# Patient Record
Sex: Female | Born: 1965 | Race: White | Hispanic: No | Marital: Married | Smoking: Former smoker
Health system: Southern US, Community
[De-identification: ages and names within clinical notes are randomized; demographics above are authoritative.]

## PROBLEM LIST (undated history)

## (undated) DIAGNOSIS — F32A Depression, unspecified: Secondary | ICD-10-CM

## (undated) DIAGNOSIS — G8929 Other chronic pain: Secondary | ICD-10-CM

## (undated) DIAGNOSIS — M810 Age-related osteoporosis without current pathological fracture: Secondary | ICD-10-CM

## (undated) DIAGNOSIS — K219 Gastro-esophageal reflux disease without esophagitis: Secondary | ICD-10-CM

## (undated) DIAGNOSIS — E669 Obesity, unspecified: Secondary | ICD-10-CM

## (undated) DIAGNOSIS — F419 Anxiety disorder, unspecified: Secondary | ICD-10-CM

## (undated) DIAGNOSIS — M549 Dorsalgia, unspecified: Secondary | ICD-10-CM

## (undated) DIAGNOSIS — M1990 Unspecified osteoarthritis, unspecified site: Secondary | ICD-10-CM

## (undated) DIAGNOSIS — E785 Hyperlipidemia, unspecified: Secondary | ICD-10-CM

## (undated) DIAGNOSIS — F329 Major depressive disorder, single episode, unspecified: Secondary | ICD-10-CM

## (undated) DIAGNOSIS — G4700 Insomnia, unspecified: Secondary | ICD-10-CM

## (undated) HISTORY — DX: Major depressive disorder, single episode, unspecified: F32.9

## (undated) HISTORY — DX: Depression, unspecified: F32.A

## (undated) HISTORY — PX: COLONOSCOPY: SHX174

## (undated) HISTORY — DX: Hyperlipidemia, unspecified: E78.5

## (undated) HISTORY — DX: Unspecified osteoarthritis, unspecified site: M19.90

## (undated) HISTORY — DX: Obesity, unspecified: E66.9

## (undated) HISTORY — PX: ESOPHAGOGASTRODUODENOSCOPY: SHX1529

## (undated) HISTORY — PX: MANDIBLE SURGERY: SHX707

---

## 1999-01-04 ENCOUNTER — Other Ambulatory Visit: Admission: RE | Admit: 1999-01-04 | Discharge: 1999-01-04 | Payer: Self-pay | Admitting: Obstetrics & Gynecology

## 2000-01-13 ENCOUNTER — Other Ambulatory Visit: Admission: RE | Admit: 2000-01-13 | Discharge: 2000-01-13 | Payer: Self-pay | Admitting: Obstetrics & Gynecology

## 2002-07-29 ENCOUNTER — Other Ambulatory Visit: Admission: RE | Admit: 2002-07-29 | Discharge: 2002-07-29 | Payer: Self-pay | Admitting: Obstetrics & Gynecology

## 2003-08-05 ENCOUNTER — Other Ambulatory Visit: Admission: RE | Admit: 2003-08-05 | Discharge: 2003-08-05 | Payer: Self-pay | Admitting: Obstetrics & Gynecology

## 2003-09-12 ENCOUNTER — Inpatient Hospital Stay (HOSPITAL_COMMUNITY): Admission: RE | Admit: 2003-09-12 | Discharge: 2003-09-12 | Payer: Self-pay | Admitting: Family Medicine

## 2005-06-06 ENCOUNTER — Other Ambulatory Visit: Admission: RE | Admit: 2005-06-06 | Discharge: 2005-06-06 | Payer: Self-pay | Admitting: Obstetrics & Gynecology

## 2006-12-16 ENCOUNTER — Emergency Department (HOSPITAL_COMMUNITY): Admission: EM | Admit: 2006-12-16 | Discharge: 2006-12-16 | Payer: Self-pay | Admitting: Emergency Medicine

## 2009-05-07 ENCOUNTER — Encounter: Admission: RE | Admit: 2009-05-07 | Discharge: 2009-05-07 | Payer: Self-pay | Admitting: Internal Medicine

## 2009-08-20 ENCOUNTER — Encounter: Admission: RE | Admit: 2009-08-20 | Discharge: 2009-08-20 | Payer: Self-pay | Admitting: Internal Medicine

## 2010-11-09 ENCOUNTER — Other Ambulatory Visit: Payer: Self-pay | Admitting: Internal Medicine

## 2010-11-09 DIAGNOSIS — R109 Unspecified abdominal pain: Secondary | ICD-10-CM

## 2010-11-10 ENCOUNTER — Ambulatory Visit
Admission: RE | Admit: 2010-11-10 | Discharge: 2010-11-10 | Disposition: A | Discharge status: Home / Self Care | Payer: BC Managed Care – PPO | Source: Ambulatory Visit | Attending: Internal Medicine | Admitting: Internal Medicine

## 2010-11-10 DIAGNOSIS — R109 Unspecified abdominal pain: Secondary | ICD-10-CM

## 2010-11-10 MED ORDER — IOHEXOL 300 MG/ML  SOLN
100.0000 mL | Freq: Once | INTRAMUSCULAR | Status: AC | PRN
  Administered 2010-11-10: 100 mL via INTRAVENOUS

## 2011-09-26 ENCOUNTER — Encounter: Payer: Self-pay | Admitting: Internal Medicine

## 2011-09-30 ENCOUNTER — Encounter: Payer: Self-pay | Admitting: Internal Medicine

## 2011-10-04 ENCOUNTER — Ambulatory Visit (INDEPENDENT_AMBULATORY_CARE_PROVIDER_SITE_OTHER): Payer: BC Managed Care – PPO | Admitting: Internal Medicine

## 2011-10-04 ENCOUNTER — Encounter: Payer: Self-pay | Admitting: Internal Medicine

## 2011-10-04 VITALS — BP 120/80 | HR 100 | Ht 67.0 in | Wt 192.6 lb

## 2011-10-04 DIAGNOSIS — R109 Unspecified abdominal pain: Secondary | ICD-10-CM

## 2011-10-04 DIAGNOSIS — M1990 Unspecified osteoarthritis, unspecified site: Secondary | ICD-10-CM | POA: Insufficient documentation

## 2011-10-04 DIAGNOSIS — E785 Hyperlipidemia, unspecified: Secondary | ICD-10-CM | POA: Insufficient documentation

## 2011-10-04 DIAGNOSIS — Z8659 Personal history of other mental and behavioral disorders: Secondary | ICD-10-CM | POA: Insufficient documentation

## 2011-10-04 MED ORDER — TRAMADOL HCL 50 MG PO TABS
50.0000 mg | ORAL_TABLET | Freq: Four times a day (QID) | ORAL | Status: DC | PRN
Start: 2011-10-04 — End: 2012-05-17

## 2011-10-04 MED ORDER — HYOSCYAMINE SULFATE 0.125 MG SL SUBL: 0.1250 mg | SUBLINGUAL_TABLET | SUBLINGUAL | Status: DC | PRN

## 2011-10-04 MED ORDER — ESOMEPRAZOLE MAGNESIUM 40 MG PO CPDR: 40.0000 mg | DELAYED_RELEASE_CAPSULE | Freq: Every day | ORAL | Status: DC

## 2011-10-04 NOTE — Patient Instructions (Signed)
We have sent the following medications to your pharmacy for you to pick up at your convenience: Levsin, Nexium, Tramadol  Dr. Rhea Belton would like to see you back in the office in 4 weeks.

## 2011-10-04 NOTE — Progress Notes (Signed)
Subjective:    Patient ID: Christine Liu, female    DOB: 1966/07/25, 46 y.o.   MRN: [id number]  HPI Mrs. Ahmed is a 46 yo female with PMH of HL, depression, and arthritis who is seen in consultation at the request of Dr. Felipa Eth for evaluation of left flank pain.  The patient reports this pain has been off and on for almost a year.  The pain is located in her left side in the midaxillary line. She reports it is at times a constant dose severe ache. She has not been able to relate this pain to eating or bowel movement. It does seem to have been worse recently.  She denies heartburn. She does report some nausea with the pain but no vomiting. No dysphagia or odynophagia. Appetite has been good. She reports normal bowel movements for her without blood or melena. She says potentially the only thing that makes this pain better as not lying on her left side. It is not seem to get worse with any particular movement. She denies trauma. She reports her PCP performed an EKG which was normal. She also remembers a CT scan from 2012 which was reportedly normal. She has tried Naprosyn for this pain without much benefit. No fevers or chills. No shortness of breath, chest pain. No palpitations.  Review of Systems Constitutional: Negative for fever, chills, night sweats, activity change, appetite change and unexpected weight change, positive for fatigue HEENT: Negative for sore throat, mouth sores and trouble swallowing. Eyes: Negative for visual disturbance Respiratory: Negative for cough, chest tightness and shortness of breath Cardiovascular: Negative for chest pain, palpitations and lower extremity swelling Gastrointestinal: See history of present illness Genitourinary: Negative for dysuria and hematuria. Musculoskeletal: Negative for back pain, arthralgias and myalgias Skin: Negative for rash or color change Neurological: Negative for headaches, weakness, numbness Hematological: Negative for adenopathy, negative  for easy bruising/bleeding Psychiatric/behavioral: Positive for history of depressed mood, negative for anxiety, positive for sleep disturbance   Past Medical History  Diagnosis Date  . Arthritis   . Depression   . Hyperlipidemia    Past Surgical History  Procedure Date  . Mandible surgery     8th grade   Current Outpatient Prescriptions  Medication Sig Dispense Refill  . buPROPion (WELLBUTRIN) 100 MG tablet Take 100 mg by mouth daily.      . celecoxib (CELEBREX) 200 MG capsule Take 200 mg by mouth daily.      . cholecalciferol (VITAMIN D) 1000 UNITS tablet Take 1,000 Units by mouth daily.       No Known Allergies  Family History  Problem Relation Age of Onset  . Arthritis Mother     RA  . Osteoporosis Mother   . Hyperlipidemia Mother   . Colon cancer Neg Hx    History   Social History  . Marital Status: Married    Spouse Name: N/A    Number of Children: 1  . Years of Education: N/A   Occupational History  . 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 Smoking status: Former Smoker -- 4 years    Types: Cigarettes    Quit date: 10/03/1986  . Smokeless tobacco: Never Used  . Alcohol Use: Yes     6 drinks q weekend-beer  . Drug Use: No      Objective:   Physical Exam BP 120/80  Pulse 100  Ht 5\' 7"  (1.702 m)  Wt 192 lb 9.6 oz (87.363 kg)  BMI 30.17 kg/m2  LMP 08/18/2011 Constitutional: Well-developed and well-nourished. No distress. HEENT: Normocephalic and atraumatic. Oropharynx is clear and moist. No oropharyngeal exudate. Conjunctivae are normal. Pupils are equal round and reactive to light. No scleral icterus. Neck: Neck supple. Trachea midline. Cardiovascular: Normal rate, regular rhythm and intact distal pulses. No M/R/G Pulmonary/chest: Effort normal and breath sounds normal. No wheezing, rales or rhonchi. There is no pain with palpation over the left rib cage anteriorly or posteriorly. There is no CVA tenderness. There is no pain over the  vertebral processes. Abdominal: Soft, nontender, nondistended. Bowel sounds active throughout. There are no masses palpable. No hepatosplenomegaly. Extremities: no clubbing, cyanosis, or edema Lymphadenopathy: No cervical adenopathy noted. Neurological: Alert and oriented to person place and time. Skin: Skin is warm and dry. No rashes noted. Psychiatric: Normal mood and affect. Behavior is normal.  The patient recalls labs being done earlier in January 2013 by her PCP. She was told her glucose was mildly elevated but otherwise unremarkable. She does remember being told her liver numbers were normal. I will request her labs  Imaging Reviewed from 11/09/10 Clinical Data: left flank pain   CT ABDOMEN AND PELVIS WITH CONTRAST   Technique:  Multidetector CT imaging of the abdomen and pelvis was performed following the standard protocol during bolus administration of intravenous contrast.   Contrast: 100 ml of omni 300   Comparison: None   Findings: The lung bases appear clear.   No pericardial or pleural effusions noted.   There is no focal liver abnormalities identified.   The spleen appears normal.   The pancreas is negative.  The adrenal glands are negative.   The right kidney is unremarkable.  No evidence for mass or nephrolithiasis.  No obstructive uropathy.   There is a small 9 mm hypodensity within the upper pole of the left kidney, image 30.  Likely simple cyst.  There is no left-sided nephrolithiasis or hydronephrosis.   The ureters appear patent.  No evidence for hydroureter or ureterolithiasis.   The urinary bladder is normal.   There is no enlarged lymph node within the upper abdomen.   There is no pelvic or inguinal adenopathy.   Uterus and adnexal structures have a normal appearance   The stomach and small bowel loops are unremarkable.  The appendix is negative.  The colon is unremarkable.   Review of the visualized osseous structures is unremarkable.     IMPRESSION:   1.  No findings to explain the patient's left flank pain. Specifically, no evidence for nephrolithiasis or obstructive uropathy.      Assessment & Plan:  46 yo female with PMH of HL, depression, and arthritis who is seen in consultation at the request of Dr. Felipa Eth for evaluation of left flank pain.  1. Left flank pain -- the patient's pain has been present off and on for the better part of a year, but there is no clear indication as to its etiology. It does not seem to relate to her GI tract specifically, and is not exacerbated by eating or bowel movement. He does not seem consistent with IBS at this point given his lack of association to her other GI functions.  I think it is reasonable to give her a trial of acid suppression therapy, and therefore I have prescribed Nexium 40 mg daily. It is possible that this is related to acid peptic disease such as gastritis or PUD, so trial of PPI seems reasonable.  I also will prescribe Levsin which  she can use as needed and as directed for spasm to see if this helps her pain. Also prescribe tramadol 50 - 100 mg up to 3 times a day prn pain.  I will see her back in 4 weeks' time to reassess her symptoms and gauge her response to therapy.  If things are not improved other considerations include endoscopy or repeat imaging. She is happy with this plan.

## 2011-11-03 ENCOUNTER — Encounter: Payer: Self-pay | Admitting: Internal Medicine

## 2011-11-04 ENCOUNTER — Encounter: Payer: Self-pay | Admitting: Internal Medicine

## 2011-11-04 ENCOUNTER — Ambulatory Visit (INDEPENDENT_AMBULATORY_CARE_PROVIDER_SITE_OTHER): Payer: BC Managed Care – PPO | Admitting: Internal Medicine

## 2011-11-04 DIAGNOSIS — R109 Unspecified abdominal pain: Secondary | ICD-10-CM

## 2011-11-04 MED ORDER — HYOSCYAMINE SULFATE 0.125 MG SL SUBL: 0.1250 mg | SUBLINGUAL_TABLET | SUBLINGUAL | Status: DC | PRN

## 2011-11-04 NOTE — Progress Notes (Signed)
  Subjective:    Patient ID: Christine Liu, female    DOB: 1966/05/04, 46 y.o.   MRN: [id number]  HPI Christine Liu is a 46 yo female with PMH of HL, depression, and arthritis who is seen in followup for left flank pain. Again, this pain has been going off and on for almost a year, is located in her left upper quadrant and left side. She reports this is continued to happen on and off, and seems to be happening more frequently. She also had an episode of right sided abdominal pain, again sharp and crampy in nature. Still does not relate this to eating or bowel movement. No benefit with Nexium. Has used sublingual Levsin with some benefit, and took the tramadol on several occasions which also seemed to help. Some nausea still but no vomiting. No dysphagia or odynophagia. Appetite still good. Bowel habits are normal. No blood or melena. No fevers or chills.  Review of Systems As per history of present illness otherwise negative  Current Medications, Allergies, Past Medical History, Past Surgical History, Family History and Social History were reviewed in [employer] record.     Objective:   Physical Exam BP 112/72  Pulse 119  Ht 5\' 7"  (1.702 m)  Wt 191 lb (86.637 kg)  BMI 29.91 kg/m2  SpO2 99%  LMP 10/08/2011 Constitutional: Well-developed and well-nourished. No distress. HEENT: Normocephalic and atraumatic. Oropharynx is clear and moist. No oropharyngeal exudate. Conjunctivae are normal. Pupils are equal round and reactive to light. No scleral icterus. Cardiovascular: Normal rate, regular rhythm and intact distal pulses. No M/R/G Pulmonary/chest: Effort normal and breath sounds normal. No wheezing, rales or rhonchi. Abdominal: Soft, mild left middle tenderness to palpation without rebound or guarding, nondistended. Bowel sounds active throughout. There are no masses palpable. No hepatosplenomegaly. Extremities: no clubbing, cyanosis, or edema Neurological: Alert and  oriented to person place and time. Skin: Skin is warm and dry. No rashes noted. Psychiatric: Normal mood and affect. Behavior is normal.  Labs: awaiting records from PCP    Assessment & Plan:  Christine Liu is a 46 yo female with PMH of HL, depression, and arthritis who is seen in followup for left flank pain.  1. Left flank pain -- the pain remains nonspecific and does not seem to definitely relate to eating or bowel movement. She's not having diarrhea or constipation.  At this point I think the best next step is CT abdomen and pelvis with contrast to better evaluate her pain. This will be ordered for today and if negative or unrevealing, then I recommended upper and lower endoscopy for further characterization and evaluation this pain. We discussed EGD and colonoscopy and the risk and benefits and she is agreeable to proceed if necessary. We will know more after the CT her abdomen and pelvis. I've advised that she stop taking her Nexium as it did not seem to benefit her at all. She can continue the Levsin and tramadol as needed. I will contact her once the CT result is available. She voices understanding and is happy with this plan.

## 2011-11-04 NOTE — Patient Instructions (Signed)
You have been scheduled for a CT scan of the abdomen and pelvis at [facility] ([address]---this is in the same building as [facility]).   You are scheduled on 11/07/2011 at 11:00. You should arrive 15 minutes prior to your appointment time for registration. Please follow the written instructions below on the day of your exam:  WARNING: IF YOU ARE ALLERGIC TO IODINE/X-RAY DYE, PLEASE NOTIFY RADIOLOGY IMMEDIATELY AT [phone]! YOU WILL BE GIVEN A 13 HOUR PREMEDICATION PREP.  1) Do not eat or drink anything after 7:00am (4 hours prior to your test) 2) You have been given 2 bottles of oral contrast to drink. The solution may tast better if refrigerated, but do NOT add ice or any other liquid to this solution. Shake             well before drinking.    Drink 1 bottle of contrast @ 9:00 (2 hours prior to your exam)  Drink 1 bottle of contrast @ 10:00 (1 hour prior to your exam)  You may take any medications as prescribed with a small amount of water except for the following: Metformin, Glucophage, Glucovance, Avandamet, Riomet, Fortamet, Actoplus Met, Janumet, Glumetza or Metaglip. The above medications must be held the day of the exam AND 48 hours after the exam.  The purpose of you drinking the oral contrast is to aid in the visualization of your intestinal tract. The contrast solution may cause some diarrhea. Before your exam is started, you will be given a small amount of fluid to drink. Depending on your individual set of symptoms, you may also receive an intravenous injection of x-ray contrast/dye. Plan on being at [facility] for 30 minutes or long, depending on the type of exam you are having performed.  If you have any questions regarding your exam or if you need to reschedule, you may call the CT department at [phone] between the hours of 8:00 am and 5:00 pm, Monday-Friday.  Stop taking Nexium and continue taking tramodol and  levsin  ________________________________________________________________________

## 2011-11-07 ENCOUNTER — Ambulatory Visit (INDEPENDENT_AMBULATORY_CARE_PROVIDER_SITE_OTHER)
Admission: RE | Admit: 2011-11-07 | Discharge: 2011-11-07 | Disposition: A | Discharge status: Home / Self Care | Payer: BC Managed Care – PPO | Source: Ambulatory Visit | Attending: Internal Medicine | Admitting: Internal Medicine

## 2011-11-07 DIAGNOSIS — R10A1 Flank pain, right side: Secondary | ICD-10-CM

## 2011-11-07 DIAGNOSIS — R109 Unspecified abdominal pain: Secondary | ICD-10-CM

## 2011-11-07 DIAGNOSIS — R10A2 Flank pain, left side: Secondary | ICD-10-CM

## 2011-11-07 MED ORDER — IOHEXOL 300 MG/ML  SOLN
100.0000 mL | Freq: Once | INTRAMUSCULAR | Status: AC | PRN
  Administered 2011-11-07: 100 mL via INTRAVENOUS

## 2011-11-08 ENCOUNTER — Telehealth: Payer: Self-pay | Admitting: *Deleted

## 2011-11-08 NOTE — Telephone Encounter (Signed)
Informed pt of her CT rsults and scheduled her for PV and ECL with Propofol.

## 2011-11-08 NOTE — Telephone Encounter (Signed)
CT is unremarkable and shows a stable hemangioma in her liver, which is of likely no medical concern No findings to explain abdominal pain. Proceed to EGD and colonoscopy as discussed in clinic. Please schedule this on a propofol day   lmom for pt to call back.

## 2011-11-08 NOTE — Telephone Encounter (Signed)
Pt called me back and LM and then I left her a message. Gave her available times for ECL appts: 11/24/11 and 11/29/11. Work/school [phone].

## 2011-11-18 ENCOUNTER — Ambulatory Visit (AMBULATORY_SURGERY_CENTER): Payer: BC Managed Care – PPO | Admitting: *Deleted

## 2011-11-18 VITALS — Ht 67.0 in | Wt 193.8 lb

## 2011-11-18 DIAGNOSIS — R160 Hepatomegaly, not elsewhere classified: Secondary | ICD-10-CM

## 2011-11-18 MED ORDER — PEG-KCL-NACL-NASULF-NA ASC-C 100 G PO SOLR: ORAL | Status: DC

## 2011-11-23 ENCOUNTER — Telehealth: Payer: Self-pay | Admitting: Gastroenterology

## 2011-11-23 ENCOUNTER — Telehealth: Payer: Self-pay | Admitting: *Deleted

## 2011-11-23 NOTE — Telephone Encounter (Signed)
lvm for pt to call me back about moving her ECL from 2pm to 10am.

## 2011-11-23 NOTE — Telephone Encounter (Signed)
Left message for patient to call back.  Calling to see if she would want to move her appointment up to an earlier available morning slot.

## 2011-11-24 ENCOUNTER — Ambulatory Visit (AMBULATORY_SURGERY_CENTER): Payer: BC Managed Care – PPO | Admitting: Internal Medicine

## 2011-11-24 ENCOUNTER — Encounter: Payer: Self-pay | Admitting: Internal Medicine

## 2011-11-24 DIAGNOSIS — R109 Unspecified abdominal pain: Secondary | ICD-10-CM

## 2011-11-24 MED ORDER — SODIUM CHLORIDE 0.9 % IV SOLN: 500.0000 mL | INTRAVENOUS | Status: DC

## 2011-11-24 NOTE — Progress Notes (Signed)
Patient did not have preoperative order for IV antibiotic SSI prophylaxis. (G8918)  Patient did not experience any of the following events: a burn prior to discharge; a fall within the facility; wrong site/side/patient/procedure/implant event; or a hospital transfer or hospital admission upon discharge from the facility. (G8907)  

## 2011-11-24 NOTE — Op Note (Signed)
 [facility] [address] [place], [place]  [address]  ENDOSCOPY PROCEDURE REPORT  PATIENT:  Christine Liu, Christine Liu  MR#:  [id number] BIRTHDATE:  [birth year], 45 yrs. old  GENDER:  female ENDOSCOPIST:  Carie Caddy. Nykira Reddix, MD Referred by:  Chilton Greathouse, M.D. PROCEDURE DATE:  11/24/2011 PROCEDURE:  EGD, diagnostic 43235 ASA CLASS:  Class I INDICATIONS:  abdominal pain, left upper quadrant MEDICATIONS:   MAC sedation, administered by CRNA, propofol (Diprivan) 200 mg IV TOPICAL ANESTHETIC:  none  DESCRIPTION OF PROCEDURE:   After the risks benefits and alternatives of the procedure were thoroughly explained, informed consent was obtained.  The [facility] [id number] endoscope was introduced through the mouth and advanced to the second portion of the duodenum, without limitations.  The instrument was slowly withdrawn as the mucosa was fully examined. <<PROCEDUREIMAGES>>  The upper, middle, and distal third of the esophagus were carefully inspected and no abnormalities were noted. The z-line was well seen at the GEJ. The endoscope was pushed into the fundus which was normal including a retroflexed view. The antrum,gastric body, first and second part of the duodenum were unremarkable. Retroflexed views revealed no abnormalities.    The scope was then withdrawn from the patient and the procedure completed.  COMPLICATIONS:  None  ENDOSCOPIC IMPRESSION: 1) Normal EGD  RECOMMENDATIONS: 1) Continue current medications. 2) Follow up as needed.  Carie Caddy. Rhea Belton, MD  CC:  Chilton Greathouse, MD The Patient  n. eSIGNEDCarie Caddy. Kimbree Casanas at 11/24/2011 02:09 PM  Donata Clay, [phone]

## 2011-11-24 NOTE — Patient Instructions (Signed)
YOU HAD AN ENDOSCOPIC PROCEDURE TODAY AT THE [facility]: Refer to the procedure report that was given to you for any specific questions about what was found during the examination.  If the procedure report does not answer your questions, please call your gastroenterologist to clarify.  If you requested that your care partner not be given the details of your procedure findings, then the procedure report has been included in a sealed envelope for you to review at your convenience later.  YOU SHOULD EXPECT: Some feelings of bloating in the abdomen. Passage of more gas than usual.  Walking can help get rid of the air that was put into your GI tract during the procedure and reduce the bloating. If you had a lower endoscopy (such as a colonoscopy or flexible sigmoidoscopy) you may notice spotting of blood in your stool or on the toilet paper. If you underwent a bowel prep for your procedure, then you may not have a normal bowel movement for a few days.  DIET: Your first meal following the procedure should be a light meal and then it is ok to progress to your normal diet.  A half-sandwich or bowl of soup is an example of a good first meal.  Heavy or fried foods are harder to digest and may make you feel nauseous or bloated.  Likewise meals heavy in dairy and vegetables can cause extra gas to form and this can also increase the bloating.  Drink plenty of fluids but you should avoid alcoholic beverages for 24 hours.  ACTIVITY: Your care partner should take you home directly after the procedure.  You should plan to take it easy, moving slowly for the rest of the day.  You can resume normal activity the day after the procedure however you should NOT DRIVE or use heavy machinery for 24 hours (because of the sedation medicines used during the test).    SYMPTOMS TO REPORT IMMEDIATELY: A gastroenterologist can be reached at any hour.  During normal business hours, 8:30 AM to 5:00 PM Monday through Friday,  call [phone].  After hours and on weekends, please call the GI answering service at [phone] who will take a message and have the physician on call contact you.   Following lower endoscopy (colonoscopy or flexible sigmoidoscopy):  Excessive amounts of blood in the stool  Significant tenderness or worsening of abdominal pains  Swelling of the abdomen that is new, acute  Fever of 100F or higher  Following upper endoscopy (EGD)  Vomiting of blood or coffee ground material  New chest pain or pain under the shoulder blades  Painful or persistently difficult swallowing  New shortness of breath  Fever of 100F or higher  Black, tarry-looking stools  FOLLOW UP: If any biopsies were taken you will be contacted by phone or by letter within the next 1-3 weeks.  Call your gastroenterologist if you have not heard about the biopsies in 3 weeks.  Our staff will call the home number listed on your records the next business day following your procedure to check on you and address any questions or concerns that you may have at that time regarding the information given to you following your procedure. This is a courtesy call and so if there is no answer at the home number and we have not heard from you through the emergency physician on call, we will assume that you have returned to your regular daily activities without incident.  SIGNATURES/CONFIDENTIALITY: You and/or your care   partner have signed paperwork which will be entered into your electronic medical record.  These signatures attest to the fact that that the information above on your After Visit Summary has been reviewed and is understood.  Full responsibility of the confidentiality of this discharge information lies with you and/or your care-partner.  

## 2011-11-24 NOTE — Op Note (Signed)
[facility] [address] [place], [place]  [address]  COLONOSCOPY PROCEDURE REPORT  PATIENT:  Christine Liu, Christine Liu  MR#:  [id number] BIRTHDATE:  1966/07/21, 45 yrs. old  GENDER:  female ENDOSCOPIST:  Carie Caddy. Ahnika Hannibal, MD REF. BY:  Chilton Greathouse, M.D. PROCEDURE DATE:  11/24/2011 PROCEDURE:  Diagnostic Colonoscopy ASA CLASS:  Class I INDICATIONS:  Abdominal pain MEDICATIONS:   MAC sedation, administered by CRNA, propofol (Diprivan) 200 mg IV  DESCRIPTION OF PROCEDURE:   After the risks benefits and alternatives of the procedure were thoroughly explained, informed consent was obtained.  Digital rectal exam was performed and revealed no rectal masses.   The [id number] endoscope was introduced through the anus and advanced to the terminal ileum which was intubated for a short distance, without limitations. The quality of the prep was good, using MoviPrep.  The instrument was then slowly withdrawn as the colon was fully examined. <<PROCEDUREIMAGES>>  FINDINGS:  The terminal ileum appeared normal.  Normal colonoscopy without polyps, masses, vascular ectasias, or inflammatory changes.   Retroflexed views in the rectum revealed no abnormalities. The scope was then withdrawn  from the cecum and the procedure completed.  COMPLICATIONS:  None  ENDOSCOPIC IMPRESSION: 1) Normal terminal ileum 2) Normal colonoscopy  RECOMMENDATIONS: 1) Continue current medications 2) Follow up as needed  Jaysion Ramseyer M. Rhea Belton, MD  CC:  Chilton Greathouse, MD The Patient  n. eSIGNEDCarie Caddy. Thresea Doble at 11/24/2011 02:13 PM  Donata Clay, [phone]

## 2011-11-25 ENCOUNTER — Telehealth: Payer: Self-pay | Admitting: *Deleted

## 2011-11-25 NOTE — Telephone Encounter (Signed)
  Follow up Call-  Call back number 11/24/2011  Post procedure Call Back phone  # [phone]  Permission to leave phone message Yes     Patient questions:  Another person's voice was on the machine with another last name.  No message left.

## 2012-01-02 ENCOUNTER — Other Ambulatory Visit: Payer: Self-pay | Admitting: Neurological Surgery

## 2012-01-25 ENCOUNTER — Inpatient Hospital Stay (HOSPITAL_COMMUNITY): Admission: RE | Admit: 2012-01-25 | Discharge: 2012-01-25 | Payer: BC Managed Care – PPO | Source: Ambulatory Visit

## 2012-01-25 ENCOUNTER — Encounter (HOSPITAL_COMMUNITY): Payer: Self-pay

## 2012-01-25 HISTORY — DX: Gastro-esophageal reflux disease without esophagitis: K21.9

## 2012-01-25 NOTE — Pre-Procedure Instructions (Signed)
20 Christine Liu  01/25/2012   Your procedure is scheduled on: May 30  Report to [facility] at 07:45 AM.  Call this number if you have problems the morning of surgery: [phone]   Remember:   Do not eat food:After Midnight.  May have clear liquids: up to 4 Hours before arrival.  Clear liquids include soda, tea, black coffee, apple or grape juice, broth.  Take these medicines the morning of surgery with A SIP OF WATER: Wellbutrin, Celebrex, Nexium, Hyoscymine, tramadol   Do not wear jewelry, make-up or nail polish.  Do not wear lotions, powders, or perfumes. You may wear deodorant.  Do not shave 48 hours prior to surgery. Men may shave face and neck.  Do not bring valuables to the hospital.  Contacts, dentures or bridgework may not be worn into surgery.  Leave suitcase in the car. After surgery it may be brought to your room.  For patients admitted to the hospital, checkout time is 11:00 AM the day of discharge.   Patients discharged the day of surgery will not be allowed to drive home.  Special Instructions: CHG Shower Use Special Wash: 1/2 bottle night before surgery and 1/2 bottle morning of surgery.   Please read over the following fact sheets that you were given: Pain Booklet, Coughing and Deep Breathing, Blood Transfusion Information and Surgical Site Infection Prevention

## 2012-01-27 ENCOUNTER — Encounter (HOSPITAL_COMMUNITY)
Admission: RE | Admit: 2012-01-27 | Discharge: 2012-01-27 | Disposition: A | Discharge status: Home / Self Care | Payer: BC Managed Care – PPO | Source: Ambulatory Visit | Attending: Neurological Surgery | Admitting: Neurological Surgery

## 2012-01-27 DIAGNOSIS — Z01811 Encounter for preprocedural respiratory examination: Secondary | ICD-10-CM | POA: Insufficient documentation

## 2012-01-27 DIAGNOSIS — I498 Other specified cardiac arrhythmias: Secondary | ICD-10-CM | POA: Insufficient documentation

## 2012-01-27 DIAGNOSIS — Z01810 Encounter for preprocedural cardiovascular examination: Secondary | ICD-10-CM | POA: Insufficient documentation

## 2012-01-27 DIAGNOSIS — Z01812 Encounter for preprocedural laboratory examination: Secondary | ICD-10-CM | POA: Insufficient documentation

## 2012-01-27 LAB — COMPREHENSIVE METABOLIC PANEL
ALT: 15 U/L (ref 0–35)
Alkaline Phosphatase: 70 U/L (ref 39–117)
CO2: 27 mEq/L (ref 19–32)
Chloride: 100 mEq/L (ref 96–112)
GFR calc Af Amer: >90 mL/min (ref >90)
GFR calc non Af Amer: 80 mL/min — ABNORMAL LOW (ref >90)
Glucose, Bld: 81 mg/dL (ref 70–99)
Potassium: 4.6 mEq/L (ref 3.5–5.1)
Sodium: 136 mEq/L (ref 135–145)
Total Bilirubin: 0.6 mg/dL (ref 0.3–1.2)

## 2012-01-27 LAB — CBC
HCT: 39.3 % (ref 36.0–46.0)
Hemoglobin: 13.5 g/dL (ref 12.0–15.0)
RBC: 4.37 MIL/uL (ref 3.87–5.11)

## 2012-01-27 LAB — SURGICAL PCR SCREEN
MRSA, PCR: NEGATIVE
Staphylococcus aureus: POSITIVE — AB

## 2012-01-27 LAB — ABO/RH: ABO/RH(D): O POS

## 2012-02-02 ENCOUNTER — Encounter (HOSPITAL_COMMUNITY): Admission: RE | Payer: Self-pay | Source: Ambulatory Visit

## 2012-02-02 ENCOUNTER — Ambulatory Visit (HOSPITAL_COMMUNITY)
Admission: RE | Admit: 2012-02-02 | Payer: BC Managed Care – PPO | Source: Ambulatory Visit | Admitting: Neurological Surgery

## 2012-02-02 SURGERY — POSTERIOR LUMBAR FUSION 1 LEVEL
Anesthesia: General | Site: Back

## 2012-03-20 ENCOUNTER — Other Ambulatory Visit: Payer: Self-pay | Admitting: Neurological Surgery

## 2012-04-04 ENCOUNTER — Other Ambulatory Visit (HOSPITAL_COMMUNITY): Payer: BC Managed Care – PPO

## 2012-04-06 NOTE — Pre-Procedure Instructions (Signed)
[address] [place]  04/06/2012   Your procedure is scheduled on:  Wednesday April 11, 2012.  Report to [facility] at 0630 AM.  Call this number if you have problems the morning of surgery: [phone]   Remember:   Do not eat food or drink:After Midnight.    Take these medicines the morning of surgery with A SIP OF WATER: Bupropion (Wellbutrin), Esomeprazole (Nexium), and Tramadol (Ultram) if needed for pain.   Do not wear jewelry, make-up or nail polish.  Do not wear lotions, powders, or perfumes.   Do not shave 48 hours prior to surgery.   Do not bring valuables to the hospital.  Contacts, dentures or bridgework may not be worn into surgery.  Leave suitcase in the car. After surgery it may be brought to your room.  For patients admitted to the hospital, checkout time is 11:00 AM the day of discharge.   Patients discharged the day of surgery will not be allowed to drive home.  Name and phone number of your driver:   Special Instructions: CHG Shower Use Special Wash: 1/2 bottle night before surgery and 1/2 bottle morning of surgery.   Please read over the following fact sheets that you were given: Pain Booklet, Coughing and Deep Breathing, Blood Transfusion Information, MRSA Information and Surgical Site Infection Prevention

## 2012-04-09 ENCOUNTER — Encounter (HOSPITAL_COMMUNITY)
Admission: RE | Admit: 2012-04-09 | Discharge: 2012-04-09 | Payer: BC Managed Care – PPO | Source: Ambulatory Visit | Attending: Neurological Surgery | Admitting: Neurological Surgery

## 2012-04-09 NOTE — Progress Notes (Signed)
Patient called and voicemail was left requesting patient to return call at earliest convenience concerning no show to 0900 scheduled preadmit appointment.

## 2012-04-11 ENCOUNTER — Inpatient Hospital Stay (HOSPITAL_COMMUNITY)
Admission: RE | Admit: 2012-04-11 | Payer: BC Managed Care – PPO | Source: Ambulatory Visit | Admitting: Neurological Surgery

## 2012-04-11 ENCOUNTER — Encounter (HOSPITAL_COMMUNITY): Admission: RE | Payer: Self-pay | Source: Ambulatory Visit

## 2012-04-11 SURGERY — POSTERIOR LUMBAR FUSION 1 LEVEL
Anesthesia: General | Site: Back

## 2012-04-30 ENCOUNTER — Other Ambulatory Visit: Payer: Self-pay | Admitting: Neurological Surgery

## 2012-05-15 ENCOUNTER — Encounter (HOSPITAL_COMMUNITY)
Admission: RE | Admit: 2012-05-15 | Payer: BC Managed Care – PPO | Source: Ambulatory Visit | Attending: Neurosurgery | Admitting: Neurosurgery

## 2012-05-15 HISTORY — DX: Anxiety disorder, unspecified: F41.9

## 2012-05-15 HISTORY — DX: Dorsalgia, unspecified: M54.9

## 2012-05-15 HISTORY — DX: Other chronic pain: G89.29

## 2012-05-15 HISTORY — DX: Insomnia, unspecified: G47.00

## 2012-05-15 NOTE — Progress Notes (Signed)
Called patient regarding PAT no answer left VM

## 2012-05-15 NOTE — Pre-Procedure Instructions (Signed)
20 Christine Liu  05/15/2012   Your procedure is scheduled on:  September 12  Report to [facility] at 05:30 AM.  Call this number if you have problems the morning of surgery: [phone]   Remember:   Do not eat or drink:After Midnight.  Take these medicines the morning of surgery with A SIP OF WATER: Wellbutrin, Celebrex, Nexium, Tramadol   Do not wear jewelry, make-up or nail polish.  Do not wear lotions, powders, or perfumes. You may wear deodorant.  Do not shave 48 hours prior to surgery. Men may shave face and neck.  Do not bring valuables to the hospital.  Contacts, dentures or bridgework may not be worn into surgery.  Leave suitcase in the car. After surgery it may be brought to your room.  For patients admitted to the hospital, checkout time is 11:00 AM the day of discharge.   Special Instructions: CHG Shower Use Special Wash: 1/2 bottle night before surgery and 1/2 bottle morning of surgery.   Please read over the following fact sheets that you were given: Pain Booklet, Coughing and Deep Breathing, Blood Transfusion Information and Surgical Site Infection Prevention

## 2012-05-16 ENCOUNTER — Encounter (HOSPITAL_COMMUNITY): Payer: Self-pay

## 2012-05-16 ENCOUNTER — Encounter (HOSPITAL_COMMUNITY): Payer: BC Managed Care – PPO | Attending: Neurological Surgery

## 2012-05-16 MED ORDER — CEFAZOLIN SODIUM-DEXTROSE 2-3 GM-% IV SOLR
2.0000 g | Freq: Once | INTRAVENOUS | Status: AC
  Administered 2012-05-17: 2 g via INTRAVENOUS
  Filled 2012-05-16: qty 50

## 2012-05-16 NOTE — Progress Notes (Signed)
Pt doesn't have a cardiologist  Denies ever having an echo/stress test/heart cath  Medical MD is Dr.Avva with [facility] on [facility]  EKG and CXR in epic from 01/27/12

## 2012-05-17 ENCOUNTER — Encounter (HOSPITAL_COMMUNITY): Payer: Self-pay | Admitting: *Deleted

## 2012-05-17 ENCOUNTER — Inpatient Hospital Stay (HOSPITAL_COMMUNITY)
Admission: RE | Admit: 2012-05-17 | Discharge: 2012-05-19 | DRG: 756 | Disposition: A | Discharge status: Home / Self Care | Payer: BC Managed Care – PPO | Source: Ambulatory Visit | Attending: Neurological Surgery | Admitting: Neurological Surgery

## 2012-05-17 ENCOUNTER — Encounter (HOSPITAL_COMMUNITY): Payer: Self-pay | Admitting: Anesthesiology

## 2012-05-17 ENCOUNTER — Encounter (HOSPITAL_COMMUNITY)
Admission: RE | Disposition: A | Discharge status: Home / Self Care | Payer: Self-pay | Source: Ambulatory Visit | Attending: Neurological Surgery

## 2012-05-17 ENCOUNTER — Ambulatory Visit (HOSPITAL_COMMUNITY): Payer: BC Managed Care – PPO | Admitting: Anesthesiology

## 2012-05-17 ENCOUNTER — Ambulatory Visit (HOSPITAL_COMMUNITY): Payer: BC Managed Care – PPO

## 2012-05-17 DIAGNOSIS — E785 Hyperlipidemia, unspecified: Secondary | ICD-10-CM | POA: Diagnosis present

## 2012-05-17 DIAGNOSIS — F411 Generalized anxiety disorder: Secondary | ICD-10-CM | POA: Diagnosis present

## 2012-05-17 DIAGNOSIS — R109 Unspecified abdominal pain: Secondary | ICD-10-CM

## 2012-05-17 DIAGNOSIS — F3289 Other specified depressive episodes: Secondary | ICD-10-CM | POA: Diagnosis present

## 2012-05-17 DIAGNOSIS — M48061 Spinal stenosis, lumbar region without neurogenic claudication: Principal | ICD-10-CM | POA: Diagnosis present

## 2012-05-17 DIAGNOSIS — M129 Arthropathy, unspecified: Secondary | ICD-10-CM | POA: Diagnosis present

## 2012-05-17 DIAGNOSIS — Z981 Arthrodesis status: Secondary | ICD-10-CM

## 2012-05-17 DIAGNOSIS — Z79899 Other long term (current) drug therapy: Secondary | ICD-10-CM

## 2012-05-17 DIAGNOSIS — G4700 Insomnia, unspecified: Secondary | ICD-10-CM | POA: Diagnosis present

## 2012-05-17 DIAGNOSIS — F329 Major depressive disorder, single episode, unspecified: Secondary | ICD-10-CM | POA: Diagnosis present

## 2012-05-17 DIAGNOSIS — Q762 Congenital spondylolisthesis: Secondary | ICD-10-CM

## 2012-05-17 DIAGNOSIS — G8929 Other chronic pain: Secondary | ICD-10-CM | POA: Diagnosis present

## 2012-05-17 HISTORY — PX: POSTERIOR FUSION LUMBAR SPINE: SUR632

## 2012-05-17 LAB — CBC WITH DIFFERENTIAL/PLATELET
Basophils Absolute: 0 10*3/uL (ref 0.0–0.1)
Basophils Relative: 1 % (ref 0–1)
Eosinophils Absolute: 0.2 10*3/uL (ref 0.0–0.7)
Eosinophils Relative: 4 % (ref 0–5)
HCT: 37 % (ref 36.0–46.0)
Hemoglobin: 12.7 g/dL (ref 12.0–15.0)
Lymphocytes Relative: 49 % — ABNORMAL HIGH (ref 12–46)
Lymphs Abs: 2.7 10*3/uL (ref 0.7–4.0)
MCH: 31.8 pg (ref 26.0–34.0)
MCHC: 34.3 g/dL (ref 30.0–36.0)
MCV: 92.7 fL (ref 78.0–100.0)
Monocytes Absolute: 0.5 10*3/uL (ref 0.1–1.0)
Monocytes Relative: 9 % (ref 3–12)
Neutro Abs: 2.1 10*3/uL (ref 1.7–7.7)
Neutrophils Relative %: 37 % — ABNORMAL LOW (ref 43–77)
Platelets: 182 10*3/uL (ref 150–400)
RBC: 3.99 MIL/uL (ref 3.87–5.11)
RDW: 12.1 % (ref 11.5–15.5)
WBC: 5.6 10*3/uL (ref 4.0–10.5)

## 2012-05-17 LAB — TYPE AND SCREEN: ABO/RH(D): O POS

## 2012-05-17 LAB — BASIC METABOLIC PANEL
BUN: 17 mg/dL (ref 6–23)
CO2: 26 mEq/L (ref 19–32)
Calcium: 8.9 mg/dL (ref 8.4–10.5)
Chloride: 106 mEq/L (ref 96–112)
Creatinine, Ser: 0.9 mg/dL (ref 0.50–1.10)
GFR calc Af Amer: 88 mL/min — ABNORMAL LOW (ref >90)
GFR calc non Af Amer: 76 mL/min — ABNORMAL LOW (ref >90)
Glucose, Bld: 97 mg/dL (ref 70–99)
Potassium: 4.3 mEq/L (ref 3.5–5.1)
Sodium: 140 mEq/L (ref 135–145)

## 2012-05-17 LAB — PROTIME-INR
INR: 0.91 (ref 0.00–1.49)
Prothrombin Time: 12.5 seconds (ref 11.6–15.2)

## 2012-05-17 LAB — HCG, SERUM, QUALITATIVE: Preg, Serum: NEGATIVE

## 2012-05-17 SURGERY — POSTERIOR LUMBAR FUSION 1 LEVEL
Anesthesia: General | Site: Back | Wound class: Clean

## 2012-05-17 MED ORDER — BUPROPION HCL 100 MG PO TABS
100.0000 mg | ORAL_TABLET | Freq: Every day | ORAL | Status: DC
  Administered 2012-05-17 – 2012-05-19 (×3): 100 mg via ORAL
  Filled 2012-05-17 (×3): qty 1

## 2012-05-17 MED ORDER — ROCURONIUM BROMIDE 100 MG/10ML IV SOLN
INTRAVENOUS | Status: DC | PRN
  Administered 2012-05-17: 50 mg via INTRAVENOUS

## 2012-05-17 MED ORDER — NEOSTIGMINE METHYLSULFATE 1 MG/ML IJ SOLN
INTRAMUSCULAR | Status: DC | PRN
  Administered 2012-05-17: 3 mg via INTRAVENOUS

## 2012-05-17 MED ORDER — HEPARIN SODIUM (PORCINE) 1000 UNIT/ML IJ SOLN
INTRAMUSCULAR | Status: AC
  Filled 2012-05-17: qty 1

## 2012-05-17 MED ORDER — CELECOXIB 200 MG PO CAPS
200.0000 mg | ORAL_CAPSULE | Freq: Every day | ORAL | Status: DC
  Administered 2012-05-17 – 2012-05-19 (×3): 200 mg via ORAL
  Filled 2012-05-17 (×3): qty 1

## 2012-05-17 MED ORDER — BUPIVACAINE HCL (PF) 0.25 % IJ SOLN
INTRAMUSCULAR | Status: DC | PRN
  Administered 2012-05-17: 5 mL

## 2012-05-17 MED ORDER — ACETAMINOPHEN 10 MG/ML IV SOLN
1000.0000 mg | Freq: Four times a day (QID) | INTRAVENOUS | Status: AC
  Administered 2012-05-17 (×2): 1000 mg via INTRAVENOUS
  Filled 2012-05-17 (×4): qty 100

## 2012-05-17 MED ORDER — DEXAMETHASONE SODIUM PHOSPHATE 4 MG/ML IJ SOLN
4.0000 mg | Freq: Four times a day (QID) | INTRAMUSCULAR | Status: DC
  Filled 2012-05-17 (×8): qty 1

## 2012-05-17 MED ORDER — SODIUM CHLORIDE 0.9 % IV SOLN: 250.0000 mL | INTRAVENOUS | Status: DC

## 2012-05-17 MED ORDER — OXYCODONE HCL 5 MG PO TABS
ORAL_TABLET | ORAL | Status: AC
  Filled 2012-05-17: qty 1

## 2012-05-17 MED ORDER — DULOXETINE HCL 60 MG PO CPEP
60.0000 mg | ORAL_CAPSULE | Freq: Every day | ORAL | Status: DC
  Administered 2012-05-17 – 2012-05-18 (×2): 60 mg via ORAL
  Filled 2012-05-17 (×3): qty 1

## 2012-05-17 MED ORDER — HEMOSTATIC AGENTS (NO CHARGE) OPTIME
TOPICAL | Status: DC | PRN
  Administered 2012-05-17: 1 via TOPICAL

## 2012-05-17 MED ORDER — OXYCODONE-ACETAMINOPHEN 5-325 MG PO TABS
1.0000 | ORAL_TABLET | ORAL | Status: DC | PRN
  Administered 2012-05-17 (×2): 2 via ORAL
  Administered 2012-05-18 (×2): 1 via ORAL
  Filled 2012-05-17: qty 2
  Filled 2012-05-17: qty 1
  Filled 2012-05-17: qty 2

## 2012-05-17 MED ORDER — FENTANYL CITRATE 0.05 MG/ML IJ SOLN
INTRAMUSCULAR | Status: DC | PRN
Start: 2012-05-17 — End: 2012-05-17
  Administered 2012-05-17: 150 ug via INTRAVENOUS
  Administered 2012-05-17 (×2): 100 ug via INTRAVENOUS

## 2012-05-17 MED ORDER — CYCLOBENZAPRINE HCL 10 MG PO TABS
ORAL_TABLET | ORAL | Status: AC
  Filled 2012-05-17: qty 1

## 2012-05-17 MED ORDER — POTASSIUM CHLORIDE IN NACL 20-0.9 MEQ/L-% IV SOLN
INTRAVENOUS | Status: DC
  Administered 2012-05-17: 21:00:00 via INTRAVENOUS
  Filled 2012-05-17 (×5): qty 1000

## 2012-05-17 MED ORDER — HYDROMORPHONE HCL PF 1 MG/ML IJ SOLN
0.2500 mg | INTRAMUSCULAR | Status: DC | PRN
  Administered 2012-05-17 (×3): 0.5 mg via INTRAVENOUS

## 2012-05-17 MED ORDER — THROMBIN 5000 UNITS EX SOLR
CUTANEOUS | Status: DC | PRN
  Administered 2012-05-17: 5000 [IU] via TOPICAL

## 2012-05-17 MED ORDER — SENNA 8.6 MG PO TABS
1.0000 | ORAL_TABLET | Freq: Two times a day (BID) | ORAL | Status: DC
  Administered 2012-05-17 – 2012-05-19 (×4): 8.6 mg via ORAL
  Filled 2012-05-17 (×5): qty 1

## 2012-05-17 MED ORDER — ZOLPIDEM TARTRATE 5 MG PO TABS: 5.0000 mg | ORAL_TABLET | Freq: Every evening | ORAL | Status: DC | PRN

## 2012-05-17 MED ORDER — MORPHINE SULFATE 10 MG/ML IJ SOLN
INTRAMUSCULAR | Status: DC | PRN
  Administered 2012-05-17 (×2): 5 mg via INTRAVENOUS

## 2012-05-17 MED ORDER — MORPHINE SULFATE 2 MG/ML IJ SOLN: 1.0000 mg | INTRAMUSCULAR | Status: DC | PRN

## 2012-05-17 MED ORDER — MIDAZOLAM HCL 5 MG/5ML IJ SOLN
INTRAMUSCULAR | Status: DC | PRN
  Administered 2012-05-17: 2 mg via INTRAVENOUS

## 2012-05-17 MED ORDER — ACETAMINOPHEN 325 MG PO TABS: 650.0000 mg | ORAL_TABLET | ORAL | Status: DC | PRN

## 2012-05-17 MED ORDER — ACETAMINOPHEN 650 MG RE SUPP: 650.0000 mg | RECTAL | Status: DC | PRN

## 2012-05-17 MED ORDER — VECURONIUM BROMIDE 10 MG IV SOLR
INTRAVENOUS | Status: DC | PRN
  Administered 2012-05-17 (×3): 2 mg via INTRAVENOUS

## 2012-05-17 MED ORDER — SODIUM CHLORIDE 0.9 % IJ SOLN: 3.0000 mL | INTRAMUSCULAR | Status: DC | PRN

## 2012-05-17 MED ORDER — ONDANSETRON HCL 4 MG/2ML IJ SOLN
INTRAMUSCULAR | Status: DC | PRN
  Administered 2012-05-17: 4 mg via INTRAVENOUS

## 2012-05-17 MED ORDER — ONDANSETRON HCL 4 MG/2ML IJ SOLN: 4.0000 mg | INTRAMUSCULAR | Status: DC | PRN

## 2012-05-17 MED ORDER — DROPERIDOL 2.5 MG/ML IJ SOLN: 0.6250 mg | INTRAMUSCULAR | Status: DC | PRN

## 2012-05-17 MED ORDER — ALBUMIN HUMAN 5 % IV SOLN
INTRAVENOUS | Status: DC | PRN
  Administered 2012-05-17: 10:00:00 via INTRAVENOUS

## 2012-05-17 MED ORDER — DEXAMETHASONE SODIUM PHOSPHATE 10 MG/ML IJ SOLN
INTRAMUSCULAR | Status: DC | PRN
  Administered 2012-05-17: 10 mg via INTRAVENOUS

## 2012-05-17 MED ORDER — LIDOCAINE HCL (CARDIAC) 20 MG/ML IV SOLN
INTRAVENOUS | Status: DC | PRN
  Administered 2012-05-17: 100 mg via INTRAVENOUS

## 2012-05-17 MED ORDER — HYOSCYAMINE SULFATE 0.125 MG SL SUBL: 0.1250 mg | SUBLINGUAL_TABLET | SUBLINGUAL | Status: DC | PRN

## 2012-05-17 MED ORDER — OXYCODONE HCL 5 MG PO TABS
5.0000 mg | ORAL_TABLET | Freq: Once | ORAL | Status: AC | PRN
  Administered 2012-05-17: 5 mg via ORAL

## 2012-05-17 MED ORDER — DEXAMETHASONE 4 MG PO TABS
4.0000 mg | ORAL_TABLET | Freq: Four times a day (QID) | ORAL | Status: DC
  Administered 2012-05-17 – 2012-05-19 (×7): 4 mg via ORAL
  Filled 2012-05-17 (×11): qty 1

## 2012-05-17 MED ORDER — HYOSCYAMINE SULFATE 0.125 MG SL SUBL
0.1250 mg | SUBLINGUAL_TABLET | SUBLINGUAL | Status: DC | PRN
  Filled 2012-05-17: qty 1

## 2012-05-17 MED ORDER — SODIUM CHLORIDE 0.9 % IJ SOLN
3.0000 mL | Freq: Two times a day (BID) | INTRAMUSCULAR | Status: DC
  Administered 2012-05-17: 3 mL via INTRAVENOUS
  Administered 2012-05-18: 10 mL via INTRAVENOUS
  Administered 2012-05-18: 3 mL via INTRAVENOUS

## 2012-05-17 MED ORDER — LACTATED RINGERS IV SOLN
INTRAVENOUS | Status: DC | PRN
  Administered 2012-05-17 (×2): via INTRAVENOUS

## 2012-05-17 MED ORDER — HYDROMORPHONE HCL PF 1 MG/ML IJ SOLN
INTRAMUSCULAR | Status: AC
  Filled 2012-05-17: qty 1

## 2012-05-17 MED ORDER — ACETAMINOPHEN 10 MG/ML IV SOLN
INTRAVENOUS | Status: AC
  Filled 2012-05-17: qty 100

## 2012-05-17 MED ORDER — DEXAMETHASONE SODIUM PHOSPHATE 10 MG/ML IJ SOLN
10.0000 mg | Freq: Once | INTRAMUSCULAR | Status: DC
  Filled 2012-05-17: qty 1

## 2012-05-17 MED ORDER — THROMBIN 20000 UNITS EX KIT
PACK | CUTANEOUS | Status: DC | PRN
  Administered 2012-05-17: 20000 [IU] via TOPICAL

## 2012-05-17 MED ORDER — PHENOL 1.4 % MT LIQD: 1.0000 | OROMUCOSAL | Status: DC | PRN

## 2012-05-17 MED ORDER — CYCLOBENZAPRINE HCL 10 MG PO TABS
10.0000 mg | ORAL_TABLET | Freq: Three times a day (TID) | ORAL | Status: DC | PRN
  Administered 2012-05-17: 10 mg via ORAL

## 2012-05-17 MED ORDER — GLYCOPYRROLATE 0.2 MG/ML IJ SOLN
INTRAMUSCULAR | Status: DC | PRN
  Administered 2012-05-17: .4 mg via INTRAVENOUS

## 2012-05-17 MED ORDER — CEFAZOLIN SODIUM 1-5 GM-% IV SOLN
1.0000 g | Freq: Three times a day (TID) | INTRAVENOUS | Status: AC
  Administered 2012-05-17 – 2012-05-18 (×2): 1 g via INTRAVENOUS
  Filled 2012-05-17 (×3): qty 50

## 2012-05-17 MED ORDER — MENTHOL 3 MG MT LOZG
1.0000 | LOZENGE | OROMUCOSAL | Status: DC | PRN
  Administered 2012-05-17: 3 mg via ORAL
  Filled 2012-05-17: qty 9

## 2012-05-17 MED ORDER — OXYCODONE HCL 5 MG/5ML PO SOLN: 5.0000 mg | Freq: Once | ORAL | Status: AC | PRN

## 2012-05-17 MED ORDER — SODIUM CHLORIDE 0.9 % IR SOLN
Status: DC | PRN
  Administered 2012-05-17: 08:00:00

## 2012-05-17 MED ORDER — BACITRACIN 50000 UNITS IM SOLR
INTRAMUSCULAR | Status: AC
  Filled 2012-05-17: qty 1

## 2012-05-17 MED ORDER — PROPOFOL 10 MG/ML IV BOLUS
INTRAVENOUS | Status: DC | PRN
  Administered 2012-05-17: 200 mg via INTRAVENOUS

## 2012-05-17 MED ORDER — 0.9 % SODIUM CHLORIDE (POUR BTL) OPTIME
TOPICAL | Status: DC | PRN
  Administered 2012-05-17: 1000 mL

## 2012-05-17 MED ORDER — OXYCODONE-ACETAMINOPHEN 5-325 MG PO TABS
ORAL_TABLET | ORAL | Status: AC
  Filled 2012-05-17: qty 2

## 2012-05-17 MED ORDER — BISACODYL 10 MG RE SUPP: 10.0000 mg | Freq: Every day | RECTAL | Status: DC | PRN

## 2012-05-17 MED ORDER — MUPIROCIN 2 % EX OINT
TOPICAL_OINTMENT | Freq: Once | CUTANEOUS | Status: DC
  Filled 2012-05-17: qty 22

## 2012-05-17 MED ORDER — VITAMIN D3 25 MCG (1000 UNIT) PO TABS
1000.0000 [IU] | ORAL_TABLET | Freq: Every day | ORAL | Status: DC
  Administered 2012-05-18 – 2012-05-19 (×2): 1000 [IU] via ORAL
  Filled 2012-05-17 (×2): qty 1

## 2012-05-17 MED ORDER — SODIUM CHLORIDE 0.9 % IV SOLN
INTRAVENOUS | Status: AC
  Filled 2012-05-17: qty 500

## 2012-05-17 SURGICAL SUPPLY — 58 items
APL SKNCLS STERI-STRIP NONHPOA (GAUZE/BANDAGES/DRESSINGS) ×1
BAG DECANTER FOR FLEXI CONT (MISCELLANEOUS) ×2 IMPLANT
BENZOIN TINCTURE PRP APPL 2/3 (GAUZE/BANDAGES/DRESSINGS) ×2 IMPLANT
BLADE SURG ROTATE 9660 (MISCELLANEOUS) IMPLANT
BUR MATCHSTICK NEURO 3.0 LAGG (BURR) ×2 IMPLANT
CAGE CAPSTONE TELAMON 12X26 (Cage) ×2 IMPLANT
CANISTER SUCTION 2500CC (MISCELLANEOUS) ×2 IMPLANT
CLOTH BEACON ORANGE TIMEOUT ST (SAFETY) ×2 IMPLANT
CONT SPEC 4OZ CLIKSEAL STRL BL (MISCELLANEOUS) ×4 IMPLANT
COVER BACK TABLE 24X17X13 BIG (DRAPES) IMPLANT
COVER TABLE BACK 60X90 (DRAPES) ×2 IMPLANT
DRAPE C-ARM 42X72 X-RAY (DRAPES) ×4 IMPLANT
DRAPE LAPAROTOMY 100X72X124 (DRAPES) ×2 IMPLANT
DRAPE POUCH INSTRU U-SHP 10X18 (DRAPES) ×2 IMPLANT
DRAPE SURG 17X23 STRL (DRAPES) ×2 IMPLANT
DRESSING TELFA 8X3 (GAUZE/BANDAGES/DRESSINGS) ×2 IMPLANT
DRSG OPSITE 4X5.5 SM (GAUZE/BANDAGES/DRESSINGS) ×3 IMPLANT
DURAPREP 26ML APPLICATOR (WOUND CARE) ×2 IMPLANT
ELECT REM PT RETURN 9FT ADLT (ELECTROSURGICAL) ×2
ELECTRODE REM PT RTRN 9FT ADLT (ELECTROSURGICAL) ×1 IMPLANT
EVACUATOR 1/8 PVC DRAIN (DRAIN) ×2 IMPLANT
GAUZE SPONGE 4X4 16PLY XRAY LF (GAUZE/BANDAGES/DRESSINGS) ×1 IMPLANT
GLOVE BIO SURGEON STRL SZ8 (GLOVE) ×4 IMPLANT
GLOVE BIOGEL PI IND STRL 7.0 (GLOVE) IMPLANT
GLOVE BIOGEL PI INDICATOR 7.0 (GLOVE) ×1
GLOVE SS BIOGEL STRL SZ 6.5 (GLOVE) IMPLANT
GLOVE SUPERSENSE BIOGEL SZ 6.5 (GLOVE) ×4
GOWN BRE IMP SLV AUR LG STRL (GOWN DISPOSABLE) ×1 IMPLANT
GOWN BRE IMP SLV AUR XL STRL (GOWN DISPOSABLE) ×4 IMPLANT
GOWN STRL REIN 2XL LVL4 (GOWN DISPOSABLE) IMPLANT
HEMOSTAT POWDER KIT SURGIFOAM (HEMOSTASIS) ×2 IMPLANT
KIT BASIN OR (CUSTOM PROCEDURE TRAY) ×2 IMPLANT
KIT ROOM TURNOVER OR (KITS) ×2 IMPLANT
MILL MEDIUM DISP (BLADE) ×1 IMPLANT
NDL HYPO 25X1 1.5 SAFETY (NEEDLE) ×1 IMPLANT
NEEDLE BONE MARROW 8GAX6 (NEEDLE) ×1 IMPLANT
NEEDLE HYPO 25X1 1.5 SAFETY (NEEDLE) ×2 IMPLANT
NS IRRIG 1000ML POUR BTL (IV SOLUTION) ×2 IMPLANT
NanOss 3D ×1 IMPLANT
PACK LAMINECTOMY NEURO (CUSTOM PROCEDURE TRAY) ×2 IMPLANT
PAD ARMBOARD 7.5X6 YLW CONV (MISCELLANEOUS) ×6 IMPLANT
SCREW MODULAR NON CAN 6.5X45 (Screw) ×4 IMPLANT
SCREW SET SPINAL F/FIREBIRD (Screw) ×4 IMPLANT
SCREW SPINAL TOP LOAD PEDICLE ×4 IMPLANT
SPONGE LAP 4X18 X RAY DECT (DISPOSABLE) IMPLANT
SPONGE SURGIFOAM ABS GEL 100 (HEMOSTASIS) ×2 IMPLANT
STRIP CLOSURE SKIN 1/2X4 (GAUZE/BANDAGES/DRESSINGS) ×3 IMPLANT
SUT VIC AB 0 CT1 18XCR BRD8 (SUTURE) ×1 IMPLANT
SUT VIC AB 0 CT1 8-18 (SUTURE) ×2
SUT VIC AB 2-0 CP2 18 (SUTURE) ×2 IMPLANT
SUT VIC AB 3-0 SH 8-18 (SUTURE) ×4 IMPLANT
SYR 20ML ECCENTRIC (SYRINGE) ×2 IMPLANT
TOWEL OR 17X24 6PK STRL BLUE (TOWEL DISPOSABLE) ×2 IMPLANT
TOWEL OR 17X26 10 PK STRL BLUE (TOWEL DISPOSABLE) ×2 IMPLANT
TRAP SPECIMEN MUCOUS 40CC (MISCELLANEOUS) IMPLANT
TRAY FOLEY CATH 14FRSI W/METER (CATHETERS) ×2 IMPLANT
WATER STERILE IRR 1000ML POUR (IV SOLUTION) ×2 IMPLANT
orthofix prelordosed rod (Rod) ×2 IMPLANT

## 2012-05-17 NOTE — Anesthesia Preprocedure Evaluation (Addendum)
Anesthesia Evaluation Anesthesia Physical Anesthesia Plan Anesthesia Quick Evaluation  

## 2012-05-17 NOTE — Progress Notes (Signed)
o2 level at 86%.  Encouraged patient to take deep breaths.  Reapplied oxygen by [place] to nares at 2l.

## 2012-05-17 NOTE — Op Note (Signed)
05/17/2012  11:03 AM  PATIENT:  Christine Liu  46 y.o. 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 Decompressive Gill-type lumbar laminectomy for L4-5 requiring more work than would be required of the typical PLIF procedure in order to adequately decompress the neural elements.  2. Posterior lumbar interbody fusion L4-5 using a PEEK interbody cages packed with morcellized autograft  3. Posterior fixation for L4-5 using Orthofix pedicle screws.  4. Intertransverse arthrodesis L4-5 using morcellized autograft and allograft. 5. bone marrow aspirate from the right iliac crest through a separate stab incision  SURGEON:  Marikay Alar, MD  ASSISTANTS: Dr. Phoebe Perch  ANESTHESIA:  General  EBL: 600 ml  Total I/O In: 2450 [I.V.:2000; Blood:200; IV Piggyback:250] Out: 805 [Urine:205; Blood:600]  BLOOD ADMINISTERED:200 CC CELLSAVER  DRAINS: Hemovac   INDICATION FOR PROCEDURE: This patient presented with severe back pain radiating into her legs. She had an MRI which showed a lytic spondylolisthesis at L4-5 with  Severe spinal stenosis. She tried medical management without relief for quite a long time. I recommended a decompression and instrumented fusion to address both her segmental instability and her spinal stenosis. Patient understood the risks, benefits, and alternatives and potential outcomes and wished to proceed.  PROCEDURE DETAILS:  The patient was brought to the operating room. After induction of generalized endotracheal anesthesia the patient was rolled into the prone position on chest rolls and all pressure points were padded. The patient's lumbar region was cleaned and then prepped with DuraPrep and draped in the usual sterile fashion. Anesthesia was injected and then a dorsal midline incision was made and carried down to the lumbosacral fascia. The fascia was opened and  the paraspinous musculature was taken down in a subperiosteal fashion to expose L4-5 bilaterally. The posterior elements at L4-5 were very loose and unstable because of her pars defects. Intraoperative fluoroscopy confirmed my level, and the spinous process was removed and complete lumbar laminectomies, hemi- facetectomies, and foraminotomies were performed at L4-5. The yellow ligament was removed to expose the underlying dura and nerve roots, and generous foraminotomies were performed to adequately decompress the neural elements. This required more work to adequately decompress the L4 and L5 nerve roots bilaterally than would be necessary in a simple PLIF exposure. Considerable time was spent decompressing the L4 and L5 nerve roots. Once the decompression was complete, I turned my attention to the posterior lower lumbar interbody fusion. The epidural venous vasculature was coagulated and cut sharply. Disc space was incised and the initial discectomy was performed with pituitary rongeurs. The disc space was distracted with sequential distractors to a height of 12 mm. We then used a series of scrapers and shavers to prepare the endplates for fusion. The midline was prepared with Epstein curettes. Once the complete discectomy was finished, we packed an appropriate sized peek interbody cage with local autograft and morcellized allograft, gently retracted the nerve root, and tapped the cage into position at L4-5 bilaterally. The midline was packed with morselized autograft and allograft. We then turned our attention to the posterior fixation. The pedicle screw entry zones were identified utilizing surface landmarks and fluoroscopy. We probed each pedicle with the pedicle probe and tapped each pedicle with the appropriate tap. We palpated with a ball probe to assure no break in the cortex. We then placed 6 5 x 45 mm pedicle screws into the pedicles bilaterally at L4-5. We then  decorticated the transverse processes and  laid a mixture of morcellized autograft and allograft out over these to perform intertransverse arthrodesis at L4-5. We then placed lordotic rods into the multiaxial screw heads of the pedicle screws and locked these in position with the locking caps and anti-torque device. We achieved compression of our grafts. We then checked our construct with AP and lateral fluoroscopy. Irrigated with copious amounts of bacitracin-containing saline solution. Placed a medium Hemovac drain through separate stab incision. Inspected the nerve roots once again to assure adequate decompression, lined to the dura with Gelfoam, and closed the muscle and the fascia with 0 Vicryl. Closed the subcutaneous tissues with 2-0 Vicryl and subcuticular tissues with 3-0 Vicryl. The skin was closed with benzoin and Steri-Strips. Dressing was then applied, the patient was awakened from general anesthesia and transported to the recovery room in stable condition. At the end of the procedure all sponge, needle and instrument counts were correct.   PLAN OF CARE: Admit to inpatient   PATIENT DISPOSITION:  PACU - hemodynamically stable.   Delay start of Pharmacological VTE agent (>24hrs) due to surgical blood loss or risk of bleeding:  yes

## 2012-05-17 NOTE — Anesthesia Procedure Notes (Signed)
Procedure Name: Intubation Date/Time: 05/17/2012 7:46 AM Performed by: Marena Chancy Pre-anesthesia Checklist: Patient identified, Timeout performed, Emergency Drugs available, Suction available and Patient being monitored Patient Re-evaluated:Patient Re-evaluated prior to inductionOxygen Delivery Method: Circle system utilized Preoxygenation: Pre-oxygenation with 100% oxygen Intubation Type: IV induction Ventilation: Mask ventilation without difficulty Laryngoscope Size: Miller and 2 Grade View: Grade I Tube type: Oral Tube size: 7.5 mm Number of attempts: 1 Placement Confirmation: ETT inserted through vocal cords under direct vision,  breath sounds checked- equal and bilateral and positive ETCO2 Secured at: 21 cm Tube secured with: Tape Dental Injury: Teeth and Oropharynx as per pre-operative assessment

## 2012-05-17 NOTE — H&P (Signed)
Subjective: Patient is a 46 y.o. female admitted for PLIF L4-5. Onset of symptoms was many months ago, progressively worse since that time.  The pain is rated severe, and is located at the low back and radiates to legs. The pain is described as aching and occurs all day. The symptoms have been progressive. Symptoms are exacerbated by exercise. MRI or CT showed spondylolisthesis and stenosis L4-5.  Past Medical History  Diagnosis Date  . Arthritis   . Hyperlipidemia     doesn't require meds at present  . Chronic back pain     fractured vertebrae in 2 places   . Depression     takes Wellbutrin daily  . Anxiety     takes Cymbalta daily  . Insomnia     related to back pain    Past Surgical History  Procedure Date  . Mandible surgery     8th grade  . Colonoscopy   . Esophagogastroduodenoscopy     Prior to Admission medications   Medication Sig Start Date End Date Taking? Authorizing Provider  buPROPion (WELLBUTRIN) 100 MG tablet Take 100 mg by mouth daily.   Yes Historical Provider, MD  celecoxib (CELEBREX) 200 MG capsule Take 200 mg by mouth daily.   Yes Historical Provider, MD  cholecalciferol (VITAMIN D) 1000 UNITS tablet Take 1,000 Units by mouth daily.   Yes Historical Provider, MD  DULoxetine (CYMBALTA) 60 MG capsule Take 60 mg by mouth at bedtime.   Yes Historical Provider, MD  hyoscyamine (LEVSIN/SL) 0.125 MG SL tablet Place 1 tablet (0.125 mg total) under the tongue every 4 (four) hours as needed for cramping. 10/04/11 10/14/11  Beverley Fiedler, MD  hyoscyamine (LEVSIN/SL) 0.125 MG SL tablet Place 1 tablet (0.125 mg total) under the tongue every 4 (four) hours as needed for cramping. 11/04/11 11/14/11  Beverley Fiedler, MD   No Known Allergies  History  Substance Use Topics  . Smoking status: Former Smoker -- 4 years    Types: Cigarettes    Quit date: 10/03/1986  . Smokeless tobacco: Never Used   Comment: quit many yrs ago  . Alcohol Use: Yes     6 drinks q weekend-beer    Family  History  Problem Relation Age of Onset  . Arthritis Mother     RA  . Osteoporosis Mother   . Hyperlipidemia Mother   . Colon cancer Neg Hx   . Stomach cancer Neg Hx   . Hypertension Father   . Hyperlipidemia Father      Review of Systems  Positive ROS: neg  All other systems have been reviewed and were otherwise negative with the exception of those mentioned in the HPI and as above.  Objective: Vital signs in last 24 hours: Temp:  [98.2 F (36.8 C)] 98.2 F (36.8 C) (09/12 0627) Pulse Rate:  [88] 88  (09/12 0627) Resp:  [20] 20  (09/12 0627) BP: (121)/(79) 121/79 mmHg (09/12 0627) SpO2:  [97 %] 97 % (09/12 0627) Weight:  [85.5 kg (188 lb 7.9 oz)] 85.5 kg (188 lb 7.9 oz) (09/12 0631)  General Appearance: Alert, cooperative, no distress, appears stated age Head: Normocephalic, without obvious abnormality, atraumatic Eyes: PERRL      Throat: Lips, mucosa, and tongue normal; teeth and gums normal Neck: Supple, symmetrical, trachea midline, no adenopathy; thyroid: No enlargement/tenderness/nodules; no carotid bruit or JVD Back: Symmetric, no curvature, ROM normal, no CVA tenderness Lungs: Clear to auscultation bilaterally, respirations unlabored Heart: Regular rate and rhythm Abdomen: Soft,  non-tender Extremities: Extremities normal, atraumatic, no cyanosis or edema Pulses: 2+ and symmetric all extremities Skin: Skin color, texture, turgor normal, no rashes or lesions  NEUROLOGIC:   Mental status: Alert and oriented x4,  no aphasia, good attention span, fund of knowledge, and memory Motor Exam - grossly normal Sensory Exam - grossly normal Reflexes:1+ Coordination - grossly normal Gait - grossly normal Balance - grossly normal Cranial Nerves: I: smell Not tested  II: visual acuity  OS: nl    OD: nl  II: visual fields Full to confrontation  II: pupils Equal, round, reactive to light  III,VII: ptosis None  III,IV,VI: extraocular muscles  Full ROM  V: mastication  Normal  V: facial light touch sensation  Normal  V,VII: corneal reflex  Present  VII: facial muscle function - upper  Normal  VII: facial muscle function - lower Normal  VIII: hearing Not tested  IX: soft palate elevation  Normal  IX,X: gag reflex Present  XI: trapezius strength  5/5  XI: sternocleidomastoid strength 5/5  XI: neck flexion strength  5/5  XII: tongue strength  Normal    Data Review Lab Results  Component Value Date   WBC 5.6 05/17/2012   HGB 12.7 05/17/2012   HCT 37.0 05/17/2012   MCV 92.7 05/17/2012   PLT 182 05/17/2012   Lab Results  Component Value Date   NA 140 05/17/2012   K 4.3 05/17/2012   CL 106 05/17/2012   CO2 26 05/17/2012   BUN 17 05/17/2012   CREATININE 0.90 05/17/2012   GLUCOSE 97 05/17/2012   Lab Results  Component Value Date   INR 0.91 05/17/2012    Assessment/Plan: Patient admitted for PLIF L4-5. Patient has failed conservative therapy.  I explained the condition and procedure to the patient and answered any questions.  Patient wishes to proceed with procedure as planned. Understands risks/ benefits and typical outcomes of procedure.   Christine Liu 05/17/2012 7:03 AM

## 2012-05-17 NOTE — Transfer of Care (Signed)
Immediate Anesthesia Transfer of Care Note  Patient: Christine Liu  Procedure(s) Performed: Procedure(s) (LRB) with comments: POSTERIOR LUMBAR FUSION 1 LEVEL (N/A) - lumbar four-five  Patient Location: PACU  Anesthesia Type: General  Level of Consciousness: awake, alert  and oriented  Airway & Oxygen Therapy: Patient Spontanous Breathing  Post-op Assessment: Report given to PACU RN, Post -op Vital signs reviewed and stable and Patient moving all extremities X 4  Post vital signs: Reviewed and stable  Complications: No apparent anesthesia complications

## 2012-05-17 NOTE — Preoperative (Signed)
Beta Blockers   Reason not to administer Beta Blockers:Not Applicable 

## 2012-05-17 NOTE — Anesthesia Postprocedure Evaluation (Signed)
Anesthesia Post Note  Patient: Christine Liu  Procedure(s) Performed: Procedure(s) (LRB): POSTERIOR LUMBAR FUSION 1 LEVEL (N/A)  Anesthesia type: general  Patient location: PACU  Post pain: Pain level controlled  Post assessment: Patient's Cardiovascular Status Stable  Last Vitals:  Filed Vitals:   05/17/12 1149  BP:   Pulse:   Temp: 36.7 C  Resp:     Post vital signs: Reviewed and stable  Level of consciousness: sedated  Complications: No apparent anesthesia complications

## 2012-05-18 NOTE — Progress Notes (Signed)
Occupational Therapy Evaluation Patient Details Name: Christine Liu MRN: [id number] DOB: 1965-11-28 Today's Date: 05/18/2012 Time: 4098-1191 OT Time Calculation (min): 22 min  OT Assessment / Plan / Recommendation Clinical Impression  Pt s/p posterior lumbar fusion thus affecting PLOF. Will benefit from acute OT services to address below problem list in prep for safe d/c home.    OT Assessment  Patient needs continued OT Services    Follow Up Recommendations  Supervision/Assistance - 24 hour    Barriers to Discharge      Equipment Recommendations  None recommended by OT    Recommendations for Other Services    Frequency  Min 2X/week    Precautions / Restrictions Precautions Precautions: Back Precaution Booklet Issued: Yes (comment) Precaution Comments: pt educated on 3/3 back precautions Restrictions Weight Bearing Restrictions: No   Pertinent Vitals/Pain See vitals    ADL  Lower Body Bathing: Simulated;Moderate assistance Where Assessed - Lower Body Bathing: Unsupported sit to stand Lower Body Dressing: Performed;Moderate assistance Where Assessed - Lower Body Dressing: Unsupported sit to stand Toilet Transfer: Simulated;Min guard Toilet Transfer Method: Sit to Barista:  (chair) Equipment Used: Back brace;Rolling walker;Gait belt Transfers/Ambulation Related to ADLs: min guard with RW ADL Comments: Pt able to perform sit<>stand from lower chair surface to simulate low toilet at home. Pt unable to cross ankles over knees to perform LB dressing.    OT Diagnosis: Generalized weakness;Acute pain  OT Problem List: Decreased activity tolerance;Pain;Decreased knowledge of use of DME or AE;Decreased knowledge of precautions OT Treatment Interventions: Self-care/ADL training;DME and/or AE instruction;Therapeutic activities;Patient/family education   OT Goals Acute Rehab OT Goals OT Goal Formulation: With patient Time For Goal Achievement:  05/25/12 Potential to Achieve Goals: Good ADL Goals Pt Will Perform Lower Body Bathing: with modified independence;Sit to stand from chair;Sit to stand from bed;with adaptive equipment ADL Goal: Lower Body Bathing - Progress: Goal set today Pt Will Perform Lower Body Dressing: with modified independence;Sit to stand from chair;Sit to stand from bed;with adaptive equipment ADL Goal: Lower Body Dressing - Progress: Goal set today Pt Will Perform Toileting - Hygiene: with modified independence;Sit to stand from 3-in-1/toilet;Standing at 3-in-1/toilet ADL Goal: Toileting - Hygiene - Progress: Goal set today Pt Will Perform Tub/Shower Transfer: Tub transfer;Transfer tub bench;Ambulation;with DME;Maintaining back safety precautions ADL Goal: Tub/Shower Transfer - Progress: Goal set today Miscellaneous OT Goals Miscellaneous OT Goal #1: Pt will be able to verbalize and generalize 3/3 back precautions during all ADL tasks. OT Goal: Miscellaneous Goal #1 - Progress: Goal set today  Visit Information  Last OT Received On: 05/18/12 Assistance Needed: +1    Subjective Data      Prior Functioning  Vision/Perception  Home Living Lives With: Spouse Available Help at Discharge: Family;Available 24 hours/day Type of Home: House Home Access: Stairs to enter [employer] of Steps: 1 Entrance Stairs-Rails: None Home Layout: One level;Laundry or work area in basement [employer] Shower/Tub: Forensic scientist: Pharmacist, community: Yes How Accessible: Accessible via walker Home Adaptive Equipment: Hand-held shower hose;Tub transfer bench;Wheelchair - manual Prior Function Level of Independence: Independent Able to Take Stairs?: Yes Driving: Yes 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 Communication Communication: No difficulties Dominant Hand: Right      Cognition  Overall Cognitive Status: Appears within functional limits for  tasks assessed/performed Arousal/Alertness: Awake/alert Orientation Level: Appears intact for tasks assessed Behavior During Session: [facility] for tasks performed    Extremity/Trunk Assessment Right Upper Extremity Assessment RUE ROM/Strength/Tone:  Within functional levels Left Upper Extremity Assessment LUE ROM/Strength/Tone: Within functional levels   Mobility  Shoulder Instructions  Bed Mobility Bed Mobility: Rolling Right;Right Sidelying to Sit;Sitting - Scoot to Edge of Bed Rolling Right: 5: Supervision Right Sidelying to Sit: 5: Supervision;HOB flat Sitting - Scoot to Edge of Bed: 5: Supervision Details for Bed Mobility Assistance: VC for log roll technique. Transfers Transfers: Sit to Stand;Stand to Sit Sit to Stand: 5: Supervision;From bed;From chair/3-in-1 Stand to Sit: 5: Supervision;To chair/3-in-1 Details for Transfer Assistance: Pt able to safely complete transfer without UE assist.       Exercise     Balance     End of Session OT - End of Session Equipment Utilized During Treatment: Gait belt;Back brace Activity Tolerance: Patient tolerated treatment well Patient left: in chair;with call bell/phone within reach;with family/visitor present Nurse Communication: Mobility status  GO    05/18/2012 Cipriano Mile OTR/L Pager [phone] Office [phone]  Cipriano Mile 05/18/2012, 9:27 AM

## 2012-05-18 NOTE — Evaluation (Signed)
Physical Therapy Evaluation Patient Details Name: Christine Liu MRN: [id number] DOB: [birth year] Today's Date: 05/18/2012 Time: 4098-1191 PT Time Calculation (min): 23 min  PT Assessment / Plan / Recommendation Clinical Impression  Pt s/p PLF L4-5. Pt is near baseline functional level, although limited secondary to pain. Pt will benefit from skilled PT in the acute care setting in order to maximize functional mobility and safety prior to d/c home    PT Assessment  Patient needs continued PT services    Follow Up Recommendations  No PT follow up;Supervision for mobility/OOB    Barriers to Discharge        Equipment Recommendations  None recommended by OT;Rolling walker with 5" wheels    Recommendations for Other Services     Frequency Min 5X/week    Precautions / Restrictions Precautions Precautions: Back Precaution Booklet Issued: Yes (comment) Precaution Comments: pt educated on 3/3 back precautions Restrictions Weight Bearing Restrictions: No   Pertinent Vitals/Pain Pain 3/10 with ambulation. RN aware      Mobility  Bed Mobility Bed Mobility: Rolling Right;Right Sidelying to Sit;Sitting - Scoot to [place] of Bed Rolling Right: 5: Supervision Right Sidelying to Sit: 5: Supervision;HOB flat Sitting - Scoot to Edge of Bed: 5: Supervision Details for Bed Mobility Assistance: VC for log roll technique and other sequencing to maintain back precautions during bed mobility Transfers Transfers: Sit to Stand;Stand to Sit Sit to Stand: 5: Supervision;From bed;From chair/3-in-1 Stand to Sit: 5: Supervision;To chair/3-in-1 Details for Transfer Assistance: Pt able to safely complete transfer without UE assist. Ambulation/Gait Ambulation/Gait Assistance: 5: Supervision Ambulation Distance (Feet): 200 Feet Assistive device: Rolling walker Ambulation/Gait Assistance Details: VC for proper sequencing with RW. Attempt ambulation without RW, although pt more comfortable and decreased  pain with RW Gait Pattern: Step-to pattern;Decreased stride length;Antalgic Gait velocity: decreased gait speed    Exercises     PT Diagnosis: Difficulty walking;Acute pain  PT Problem List: Decreased activity tolerance;Decreased mobility;Decreased knowledge of use of DME;Decreased knowledge of precautions;Decreased safety awareness;Pain PT Treatment Interventions: DME instruction;Gait training;Stair training;Functional mobility training;Therapeutic activities;Patient/family education   PT Goals Acute Rehab PT Goals PT Goal Formulation: With patient Time For Goal Achievement: 05/25/12 Potential to Achieve Goals: Good Pt will Roll Supine to Right Side: Independently PT Goal: Rolling Supine to Right Side - Progress: Goal set today Pt will Roll Supine to Left Side: Independently PT Goal: Rolling Supine to Left Side - Progress: Goal set today Pt will go Supine/Side to Sit: Independently PT Goal: Supine/Side to Sit - Progress: Goal set today Pt will go Sit to Supine/Side: Independently PT Goal: Sit to Supine/Side - Progress: Goal set today Pt will go Sit to Stand: with modified independence PT Goal: Sit to Stand - Progress: Goal set today Pt will go Stand to Sit: with modified independence PT Goal: Stand to Sit - Progress: Goal set today Pt will Transfer Bed to Chair/Chair to Bed: with modified independence PT Transfer Goal: Bed to Chair/Chair to Bed - Progress: Goal set today Pt will Ambulate: >150 feet;with modified independence;with least restrictive assistive device PT Goal: Ambulate - Progress: Goal set today Pt will Go Up / Down Stairs: 1-2 stairs;with supervision PT Goal: Up/Down Stairs - Progress: Goal set today  Visit Information  Last PT Received On: 05/18/12 Assistance Needed: +1 PT/OT Co-Evaluation/Treatment: Yes    Subjective Data  Patient Stated Goal: to go home   Prior Functioning  Home Living Lives With: Spouse Available Help at Discharge: Family;Available 24  hours/day Type of  Home: House Home Access: Stairs to enter [employer] of Steps: 1 Entrance Stairs-Rails: None Home Layout: One level;Laundry or work area in basement [employer] Shower/Tub: Forensic scientist: Pharmacist, community: Yes How Accessible: Accessible via walker Home Adaptive Equipment: Hand-held shower hose;Tub transfer bench;Wheelchair - manual Prior Function Level of Independence: Independent Able to Take Stairs?: Yes Driving: Yes 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 Communication Communication: No difficulties Dominant Hand: Right    Cognition  Overall Cognitive Status: Appears within functional limits for tasks assessed/performed Arousal/Alertness: Awake/alert Orientation Level: Appears intact for tasks assessed Behavior During Session: [facility] for tasks performed    Extremity/Trunk Assessment Right Upper Extremity Assessment RUE ROM/Strength/Tone: Within functional levels Left Upper Extremity Assessment LUE ROM/Strength/Tone: Within functional levels Right Lower Extremity Assessment RLE ROM/Strength/Tone: Within functional levels RLE Sensation: WFL - Light Touch Left Lower Extremity Assessment LLE ROM/Strength/Tone: Within functional levels LLE Sensation: WFL - Light Touch   Balance    End of Session PT - End of Session Equipment Utilized During Treatment: Gait belt;Back brace Activity Tolerance: Patient tolerated treatment well Patient left: in chair;with call bell/phone within reach;with family/visitor present Nurse Communication: Mobility status    Milana Kidney 05/18/2012, 9:32 AM  05/18/2012 Milana Kidney DPT PAGER: [phone] OFFICE: [phone]

## 2012-05-18 NOTE — Progress Notes (Signed)
Occupational Therapy Treatment and discharge Patient Details Name: Christine Liu MRN: [id number] DOB: 01/20/1966 Today's Date: 05/18/2012 Time: 3086-5784 OT Time Calculation (min): 14 min  OT Assessment / Plan / Recommendation Comments on Treatment Session Pt has met all goals and has no further OT needs.    Follow Up Recommendations  Supervision/Assistance - 24 hour    Barriers to Discharge       Equipment Recommendations  None recommended by OT    Recommendations for Other Services    Frequency Min 2X/week   Plan All goals met and education completed, patient discharged from OT services;Discharge plan remains appropriate    Precautions / Restrictions Precautions Precautions: Back Precaution Booklet Issued: Yes (comment) Precaution Comments: Pt able to recall 3/3 back precautions.   Pertinent Vitals/Pain See vitals    ADL  Lower Body Bathing: Simulated;Modified independent Where Assessed - Lower Body Bathing: Unsupported sit to stand Lower Body Dressing: Performed;Modified independent Where Assessed - Lower Body Dressing: Unsupported sit to stand Toilet Transfer: Simulated;Modified independent Toilet Transfer Method: Sit to Barista: Comfort height toilet Toileting - Clothing Manipulation and Hygiene: Simulated;Modified independent Where Assessed - Toileting Clothing Manipulation and Hygiene: Standing Equipment Used: Back brace;Rolling walker;Sock aid;Reacher;Long-handled sponge;Long-handled shoe horn Transfers/Ambulation Related to ADLs: mod I with RW ADL Comments: 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  Pt plans to purchase hip kit from hospital gift shop. Pt reports she has not had a problem performing toileting hygiene since sx (front peri area). Educated pt on use of toilet aid (open tongs) for back peri care, and pt verbalized understanding. Pt had just finished ambulating around unit and was too fatigued to ambulated to gym. Pt  independently verbalized correct tub transfer technique with use of tub bench. Recommended pt use tub bench intially upon d/c home.    OT Diagnosis:    OT Problem List:   OT Treatment Interventions:     OT Goals ADL Goals Pt Will Perform Lower Body Bathing: with modified independence;Sit to stand from chair;Sit to stand from bed;with adaptive equipment ADL Goal: Lower Body Bathing - Progress: Met Pt Will Perform Lower Body Dressing: with modified independence;Sit to stand from chair;Sit to stand from bed;with adaptive equipment ADL Goal: Lower Body Dressing - Progress: Met Pt Will Perform Toileting - Hygiene: with modified independence;Sit to stand from 3-in-1/toilet;Standing at 3-in-1/toilet ADL Goal: Toileting - Hygiene - Progress: Met Pt Will Perform Tub/Shower Transfer: Tub transfer;Transfer tub bench;Ambulation;with DME;Maintaining back safety precautions ADL Goal: Tub/Shower Transfer - Progress: Met Miscellaneous OT Goals Miscellaneous OT Goal #1: Pt will be able to verbalize and generalize 3/3 back precautions during all ADL tasks. OT Goal: Miscellaneous Goal #1 - Progress: Met  Visit Information  Last OT Received On: 05/18/12    Subjective Data      Prior Functioning       Cognition  Overall Cognitive Status: Appears within functional limits for tasks assessed/performed Arousal/Alertness: Awake/alert Orientation Level: Appears intact for tasks assessed Behavior During Session: [facility] for tasks performed    Mobility  Shoulder Instructions Bed Mobility Bed Mobility: Not assessed Transfers Transfers: Sit to Stand;Stand to Sit Sit to Stand: 6: Modified independent (Device/Increase time);From chair/3-in-1 Stand to Sit: 6: Modified independent (Device/Increase time);To chair/3-in-1       Exercises      Balance     End of Session OT - End of Session Equipment Utilized During Treatment: Back brace Activity Tolerance: Patient tolerated treatment well Patient left: in  chair;with call  bell/phone within reach Nurse Communication: Mobility status  GO   05/18/2012 Cipriano Mile OTR/L Pager [phone] Office [phone]   Cipriano Mile 05/18/2012, 4:41 PM

## 2012-05-18 NOTE — Care Management Note (Signed)
    Page 1 of 1   05/21/2012     10:53:04 AM   CARE MANAGEMENT NOTE 05/21/2012  Patient:  Christine Liu, Christine Liu   Account Number:  [id number]  Date Initiated:  05/18/2012  Documentation initiated by:  Onnie Boer  Subjective/Objective Assessment:   PT WAS ADMITTED FOR BACK SURGERY     Action/Plan:   PROGRESSION OF CARE AND DISCHARGE PLANNING   Anticipated DC Date:  05/20/2012   Anticipated DC Plan:  HOME W HOME HEALTH SERVICES      DC Planning Services  CM consult      Choice offered to / List presented to:  C-1 Patient   DME arranged  Levan Hurst      DME agency  [facility]        Status of service:  Completed, signed off Medicare Important Message given?   (If response is "NO", the following Medicare IM given date fields will be blank) Date Medicare IM given:   Date Additional Medicare IM given:    Discharge Disposition:  HOME/SELF CARE  Per UR Regulation:  Reviewed for med. necessity/level of care/duration of stay  If discussed at Long Length of Stay Meetings, dates discussed:    Comments:  05/18/12 Onnie Boer, RN, BSN 1514 PT ARRANGED WITH [facility] FOR A RW, PT IS TO DC TO HOME TOMORROW  05/18/12 Onnie Boer, RN, BSN 1107 PT WAS ADMITTED FROM HOME WITH SELF CARE.  WILL F/U ON DC NEEDS.

## 2012-05-18 NOTE — Progress Notes (Signed)
Patient ID: Christine Liu, female   DOB: 09-23-1965, 46 y.o.   MRN: [id number] Subjective: Patient reports she's doing fantastic. Very little back pain, no leg pain. No n/t./w.  Objective: Vital signs in last 24 hours: Temp:  [97.1 F (36.2 C)-99.1 F (37.3 C)] 98.1 F (36.7 C) (09/13 0622) Pulse Rate:  [81-128] 101  (09/13 0622) Resp:  [6-19] 18  (09/13 0622) BP: (109-135)/(64-72) 125/72 mmHg (09/13 0622) SpO2:  [90 %-100 %] 96 % (09/13 0622)  Intake/Output from previous day: 09/12 0701 - 09/13 0700 In: 3490 [P.O.:840; I.V.:2200; Blood:200; IV Piggyback:250] Out: 1065 [Urine:430; Drains:35; Blood:600] Intake/Output this shift:    Neurologic: Grossly normal  Lab Results: Lab Results  Component Value Date   WBC 5.6 05/17/2012   HGB 12.7 05/17/2012   HCT 37.0 05/17/2012   MCV 92.7 05/17/2012   PLT 182 05/17/2012   Lab Results  Component Value Date   INR 0.91 05/17/2012   BMET Lab Results  Component Value Date   NA 140 05/17/2012   K 4.3 05/17/2012   CL 106 05/17/2012   CO2 26 05/17/2012   GLUCOSE 97 05/17/2012   BUN 17 05/17/2012   CREATININE 0.90 05/17/2012   CALCIUM 8.9 05/17/2012    Studies/Results: Dg Lumbar Spine 2-3 Views  05/17/2012  *RADIOLOGY REPORT*  Clinical Data: Lumbar fusion surgery  DG C-ARM 61-120 MIN,LUMBAR SPINE - 2-3 VIEW  Comparison: 11/07/2011  Findings: Two spot images from intraoperative C-arm fluoroscopy document changes of PLIF L4-5.  Bilateral pedicle screws at each level and vertical interconnecting rods appear intact.  Graft markers project in the interspace.  Mild grade 1 anterolisthesis is again noted.  IMPRESSION:  1.  P L I F L4-5   Original Report Authenticated By: Osa Craver, M.D.    Dg C-arm 61-120 Min  05/17/2012  *RADIOLOGY REPORT*  Clinical Data: Lumbar fusion surgery  DG C-ARM 61-120 MIN,LUMBAR SPINE - 2-3 VIEW  Comparison: 11/07/2011  Findings: Two spot images from intraoperative C-arm fluoroscopy document changes of PLIF L4-5.   Bilateral pedicle screws at each level and vertical interconnecting rods appear intact.  Graft markers project in the interspace.  Mild grade 1 anterolisthesis is again noted.  IMPRESSION:  1.  P L I F L4-5   Original Report Authenticated By: Osa Craver, M.D.     Assessment/Plan: Doing great. Likely home tomorrow.   LOS: 1 day    JONES,DAVID S 05/18/2012, 10:22 AM

## 2012-05-19 MED ORDER — OXYCODONE-ACETAMINOPHEN 5-325 MG PO TABS: 1.0000 | ORAL_TABLET | ORAL | Status: DC | PRN

## 2012-05-19 MED ORDER — CYCLOBENZAPRINE HCL 10 MG PO TABS: 10.0000 mg | ORAL_TABLET | Freq: Three times a day (TID) | ORAL | Status: DC | PRN

## 2012-05-19 NOTE — Progress Notes (Signed)
Discharge instructions relayed to patient, no questions or concerns. Rx's given to patient by MD. IV D/C'd. Patient already with a RW delivered to her for home.  Christine Liu, Christine Liu

## 2012-05-19 NOTE — Discharge Summary (Signed)
Physician Discharge Summary  Patient ID: Christine Liu MRN: [id number] DOB/AGE: 11/13/1965 46 y.o.  Admit date: 05/17/2012 Discharge date: 05/19/2012  Admission Diagnoses: spondylolisthesis/ stenosis L4-5   Discharge Diagnoses: same   Discharged Condition: good  Hospital Course: The patient was admitted on 05/17/2012 and taken to the operating room where the patient underwent PLIF L4-5. The patient tolerated the procedure well and was taken to the recovery room and then to the floor in stable condition. The hospital course was routine. There were no complications. The wound remained clean dry and intact. Pt had appropriate back soreness. No complaints of leg pain or new N/T/W. The patient remained afebrile with stable vital signs, and tolerated a regular diet. The patient continued to increase activities, and pain was well controlled with oral pain medications.   Consults: None  Significant Diagnostic Studies:  Results for orders placed during the hospital encounter of 05/17/12  HCG, SERUM, QUALITATIVE      Component Value Range   Preg, Serum NEGATIVE  NEGATIVE  BASIC METABOLIC PANEL      Component Value Range   Sodium 140  135 - 145 mEq/L   Potassium 4.3  3.5 - 5.1 mEq/L   Chloride 106  96 - 112 mEq/L   CO2 26  19 - 32 mEq/L   Glucose, Bld 97  70 - 99 mg/dL   BUN 17  6 - 23 mg/dL   Creatinine, Ser 4.09  0.50 - 1.10 mg/dL   Calcium 8.9  8.4 - 81.1 mg/dL   GFR calc non Af Amer 76 (*) >90 mL/min   GFR calc Af Amer 88 (*) >90 mL/min  CBC WITH DIFFERENTIAL      Component Value Range   WBC 5.6  4.0 - 10.5 K/uL   RBC 3.99  3.87 - 5.11 MIL/uL   Hemoglobin 12.7  12.0 - 15.0 g/dL   HCT 91.4  78.2 - 95.6 %   MCV 92.7  78.0 - 100.0 fL   MCH 31.8  26.0 - 34.0 pg   MCHC 34.3  30.0 - 36.0 g/dL   RDW 21.3  08.6 - 57.8 %   Platelets 182  150 - 400 K/uL   Neutrophils Relative 37 (*) 43 - 77 %   Neutro Abs 2.1  1.7 - 7.7 K/uL   Lymphocytes Relative 49 (*) 12 - 46 %   Lymphs Abs 2.7   0.7 - 4.0 K/uL   Monocytes Relative 9  3 - 12 %   Monocytes Absolute 0.5  0.1 - 1.0 K/uL   Eosinophils Relative 4  0 - 5 %   Eosinophils Absolute 0.2  0.0 - 0.7 K/uL   Basophils Relative 1  0 - 1 %   Basophils Absolute 0.0  0.0 - 0.1 K/uL  PROTIME-INR      Component Value Range   Prothrombin Time 12.5  11.6 - 15.2 seconds   INR 0.91  0.00 - 1.49  TYPE AND SCREEN      Component Value Range   ABO/RH(D) O POS     Antibody Screen NEG     Sample Expiration 05/20/2012      Dg Lumbar Spine 2-3 Views  05/17/2012  *RADIOLOGY REPORT*  Clinical Data: Lumbar fusion surgery  DG C-ARM 61-120 MIN,LUMBAR SPINE - 2-3 VIEW  Comparison: 11/07/2011  Findings: Two spot images from intraoperative C-arm fluoroscopy document changes of PLIF L4-5.  Bilateral pedicle screws at each level and vertical interconnecting rods appear intact.  Graft markers project in  the interspace.  Mild grade 1 anterolisthesis is again noted.  IMPRESSION:  1.  P L I F L4-5   Original Report Authenticated By: Osa Craver, M.D.    Dg C-arm 61-120 Min  05/17/2012  *RADIOLOGY REPORT*  Clinical Data: Lumbar fusion surgery  DG C-ARM 61-120 MIN,LUMBAR SPINE - 2-3 VIEW  Comparison: 11/07/2011  Findings: Two spot images from intraoperative C-arm fluoroscopy document changes of PLIF L4-5.  Bilateral pedicle screws at each level and vertical interconnecting rods appear intact.  Graft markers project in the interspace.  Mild grade 1 anterolisthesis is again noted.  IMPRESSION:  1.  P L I F L4-5   Original Report Authenticated By: Osa Craver, M.D.     Antibiotics:  Anti-infectives     Start     Dose/Rate Route Frequency Ordered Stop   05/17/12 2000   ceFAZolin (ANCEF) IVPB 1 g/50 mL premix        1 g 100 mL/hr over 30 Minutes Intravenous Every 8 hours 05/17/12 1818 05/18/12 0437   05/17/12 0815   bacitracin 50,000 Units in sodium chloride irrigation 0.9 % 500 mL irrigation  Status:  Discontinued          As needed  05/17/12 0815 05/17/12 1102   05/17/12 0713   bacitracin 16109 UNITS injection     Comments: WALSH, AMY: cabinet override         05/17/12 0713 05/17/12 1914   05/16/12 1500   ceFAZolin (ANCEF) IVPB 2 g/50 mL premix        2 g 100 mL/hr over 30 Minutes Intravenous  Once 05/16/12 1452 05/17/12 0729          Discharge Exam: Blood pressure 114/67, pulse 94, temperature 98.2 F (36.8 C), temperature source Oral, resp. rate 20, height 5\' 7"  (1.702 m), weight 85.5 kg (188 lb 7.9 oz), last menstrual period 05/11/2012, SpO2 96.00%. Neurologic: Grossly normal Incision CDI  Discharge Medications:     Medication List     As of 05/19/2012  8:29 AM    STOP taking these medications         celecoxib 200 MG capsule   Commonly known as: CELEBREX      TAKE these medications         buPROPion 100 MG tablet   Commonly known as: WELLBUTRIN   Take 100 mg by mouth daily.      cholecalciferol 1000 UNITS tablet   Commonly known as: VITAMIN D   Take 1,000 Units by mouth daily.      cyclobenzaprine 10 MG tablet   Commonly known as: FLEXERIL   Take 1 tablet (10 mg total) by mouth 3 (three) times daily as needed for muscle spasms.      DULoxetine 60 MG capsule   Commonly known as: CYMBALTA   Take 60 mg by mouth at bedtime.      hyoscyamine 0.125 MG SL tablet   Commonly known as: LEVSIN SL   Place 1 tablet (0.125 mg total) under the tongue every 4 (four) hours as needed for cramping.      hyoscyamine 0.125 MG SL tablet   Commonly known as: LEVSIN SL   Place 1 tablet (0.125 mg total) under the tongue every 4 (four) hours as needed for cramping.      oxyCODONE-acetaminophen 5-325 MG per tablet   Commonly known as: PERCOCET/ROXICET   Take 1-2 tablets by mouth every 4 (four) hours as needed.  Disposition: Home  Final Dx: PLIF      Discharge Orders    Future Orders Please Complete By Expires   Diet - low sodium heart healthy      Increase activity slowly      Driving  Restrictions      Comments:   3 weeks   Lifting restrictions      Comments:   Less than 10 lbs   Sexual Activity Restrictions      Comments:   none   Call MD for:  temperature >100.4      Call MD for:  persistant nausea and vomiting      Call MD for:  severe uncontrolled pain      Call MD for:  redness, tenderness, or signs of infection (pain, swelling, redness, odor or green/yellow discharge around incision site)      Call MD for:  difficulty breathing, headache or visual disturbances         Follow-up Information    Follow up with Deyjah Kindel S, MD. Schedule an appointment as soon as possible for a visit in 3 weeks.   Contact information:   [address] [place] [address] [phone]           Signed: Tia Alert 05/19/2012, 8:29 AM

## 2012-05-19 NOTE — Progress Notes (Signed)
Physical Therapy Discharge Patient Details Name: Christine Liu MRN: [id number] DOB: [birth year] Today's Date: 05/19/2012 Time: 0865-7846 PT Time Calculation (min): 20 min  Patient discharged from PT services secondary to goals met and no further PT needs identified.  Please see latest therapy progress note for current level of functioning and progress toward goals.    Progress and discharge plan discussed with patient and/or caregiver: Patient/Caregiver agrees with plan    Milana Kidney 05/19/2012, 10:46 AM

## 2012-05-19 NOTE — Progress Notes (Signed)
Physical Therapy Treatment Patient Details Name: MALLOREY MULVANEY MRN: [id number] DOB: [birth year] Today's Date: 05/19/2012 Time: 9562-1308 PT Time Calculation (min): 20 min  PT Assessment / Plan / Recommendation Comments on Treatment Session  Pt is at an independent level for all mobility, supervision to mod I on ambulation and stairs for safety. Plan for pt to d/c home today.     Follow Up Recommendations  No PT follow up;Supervision for mobility/OOB    Barriers to Discharge        Equipment Recommendations  None recommended by OT    Recommendations for Other Services    Frequency     Plan All goals met and education completed, patient dischaged from PT services    Precautions / Restrictions Precautions Precautions: Back Precaution Booklet Issued: Yes (comment) Precaution Comments: Pt able to recall 3/3 back precautions. Restrictions Weight Bearing Restrictions: No   Pertinent Vitals/Pain No complaint of pain    Mobility  Bed Mobility Bed Mobility: Rolling Right;Rolling Left;Sitting - Scoot to Edge of Bed;Right Sidelying to Sit Rolling Right: 7: Independent Rolling Left: 7: Independent Right Sidelying to Sit: 7: Independent Sitting - Scoot to Edge of Bed: 7: Independent Sit to Supine: 7: Independent Transfers Transfers: Sit to Stand;Stand to Sit Sit to Stand: 7: Independent Stand to Sit: 7: Independent Ambulation/Gait Ambulation/Gait Assistance: 6: Modified independent (Device/Increase time) Ambulation Distance (Feet): 400 Feet Assistive device: Rolling walker Gait Pattern: Step-to pattern;Decreased stride length;Antalgic Gait velocity: decreased gait speed Stairs: Yes Stairs Assistance: 5: Supervision Stairs Assistance Details (indicate cue type and reason): VC for proper sequencing for safety on stairs Stair Management Technique: One rail Left;Forwards;Step to pattern Number of Stairs: 12     Exercises     PT Diagnosis:    PT Problem List:   PT Treatment  Interventions:     PT Goals Acute Rehab PT Goals PT Goal Formulation: With patient PT Goal: Rolling Supine to Right Side - Progress: Met PT Goal: Rolling Supine to Left Side - Progress: Met PT Goal: Supine/Side to Sit - Progress: Met PT Goal: Sit to Supine/Side - Progress: Met PT Goal: Sit to Stand - Progress: Met PT Goal: Stand to Sit - Progress: Met PT Transfer Goal: Bed to Chair/Chair to Bed - Progress: Met PT Goal: Ambulate - Progress: Met PT Goal: Up/Down Stairs - Progress: Met  Visit Information  Last PT Received On: 05/19/12 Assistance Needed: +1    Subjective Data      Cognition  Overall Cognitive Status: Appears within functional limits for tasks assessed/performed Arousal/Alertness: Awake/alert Orientation Level: Appears intact for tasks assessed Behavior During Session: [facility] for tasks performed    Balance     End of Session PT - End of Session Equipment Utilized During Treatment: Gait belt;Back brace Activity Tolerance: Patient tolerated treatment well Patient left: in bed;with call bell/phone within reach;with family/visitor present Nurse Communication: Mobility status   GP     Milana Kidney 05/19/2012, 10:46 AM

## 2012-05-21 MED FILL — Sodium Chloride Irrigation Soln 0.9%: Qty: 3000 | Status: AC

## 2012-05-21 MED FILL — Heparin Sodium (Porcine) Inj 1000 Unit/ML: INTRAMUSCULAR | Qty: 30 | Status: AC

## 2012-05-21 MED FILL — Sodium Chloride IV Soln 0.9%: INTRAVENOUS | Qty: 1000 | Status: AC

## 2012-05-29 ENCOUNTER — Encounter (HOSPITAL_COMMUNITY): Payer: Self-pay | Admitting: Pharmacy Technician

## 2012-05-30 ENCOUNTER — Encounter (HOSPITAL_COMMUNITY)
Admission: RE | Disposition: A | Discharge status: Home / Self Care | Payer: Self-pay | Source: Ambulatory Visit | Attending: Neurological Surgery

## 2012-05-30 ENCOUNTER — Encounter (HOSPITAL_COMMUNITY): Payer: Self-pay | Admitting: *Deleted

## 2012-05-30 ENCOUNTER — Ambulatory Visit (HOSPITAL_COMMUNITY): Payer: BC Managed Care – PPO | Admitting: Anesthesiology

## 2012-05-30 ENCOUNTER — Encounter (HOSPITAL_COMMUNITY): Payer: Self-pay | Admitting: Anesthesiology

## 2012-05-30 ENCOUNTER — Inpatient Hospital Stay (HOSPITAL_COMMUNITY)
Admission: RE | Admit: 2012-05-30 | Discharge: 2012-06-02 | DRG: 418 | Disposition: A | Discharge status: Home / Self Care | Payer: BC Managed Care – PPO | Source: Ambulatory Visit | Attending: Neurosurgery | Admitting: Neurosurgery

## 2012-05-30 DIAGNOSIS — IMO0002 Reserved for concepts with insufficient information to code with codable children: Secondary | ICD-10-CM | POA: Diagnosis present

## 2012-05-30 DIAGNOSIS — Z981 Arthrodesis status: Secondary | ICD-10-CM

## 2012-05-30 DIAGNOSIS — Y838 Other surgical procedures as the cause of abnormal reaction of the patient, or of later complication, without mention of misadventure at the time of the procedure: Secondary | ICD-10-CM | POA: Diagnosis present

## 2012-05-30 DIAGNOSIS — F329 Major depressive disorder, single episode, unspecified: Secondary | ICD-10-CM | POA: Diagnosis present

## 2012-05-30 DIAGNOSIS — Y92009 Unspecified place in unspecified non-institutional (private) residence as the place of occurrence of the external cause: Secondary | ICD-10-CM

## 2012-05-30 DIAGNOSIS — T8140XA Infection following a procedure, unspecified, initial encounter: Principal | ICD-10-CM | POA: Diagnosis present

## 2012-05-30 DIAGNOSIS — Z87891 Personal history of nicotine dependence: Secondary | ICD-10-CM

## 2012-05-30 DIAGNOSIS — G8929 Other chronic pain: Secondary | ICD-10-CM | POA: Diagnosis present

## 2012-05-30 DIAGNOSIS — E785 Hyperlipidemia, unspecified: Secondary | ICD-10-CM | POA: Diagnosis present

## 2012-05-30 DIAGNOSIS — G4701 Insomnia due to medical condition: Secondary | ICD-10-CM | POA: Diagnosis present

## 2012-05-30 DIAGNOSIS — R252 Cramp and spasm: Secondary | ICD-10-CM | POA: Diagnosis not present

## 2012-05-30 DIAGNOSIS — F3289 Other specified depressive episodes: Secondary | ICD-10-CM | POA: Diagnosis present

## 2012-05-30 DIAGNOSIS — M549 Dorsalgia, unspecified: Secondary | ICD-10-CM | POA: Diagnosis present

## 2012-05-30 DIAGNOSIS — F411 Generalized anxiety disorder: Secondary | ICD-10-CM | POA: Diagnosis present

## 2012-05-30 HISTORY — DX: Age-related osteoporosis without current pathological fracture: M81.0

## 2012-05-30 HISTORY — PX: INCISION AND DRAINAGE POSTERIOR LUMBAR SPINE: SHX1805

## 2012-05-30 HISTORY — PX: LUMBAR WOUND DEBRIDEMENT: SHX1988

## 2012-05-30 LAB — GRAM STAIN

## 2012-05-30 LAB — SURGICAL PCR SCREEN: MRSA, PCR: NEGATIVE

## 2012-05-30 SURGERY — LUMBAR WOUND DEBRIDEMENT
Anesthesia: General | Site: Back | Laterality: Bilateral | Wound class: Dirty or Infected

## 2012-05-30 MED ORDER — DEXTROSE 5 % IV SOLN
1.0000 g | INTRAVENOUS | Status: DC | PRN
  Administered 2012-05-30: 1 g via INTRAVENOUS

## 2012-05-30 MED ORDER — HYOSCYAMINE SULFATE 0.125 MG SL SUBL
0.1250 mg | SUBLINGUAL_TABLET | SUBLINGUAL | Status: DC | PRN
  Filled 2012-05-30: qty 1

## 2012-05-30 MED ORDER — SODIUM CHLORIDE 0.9 % IJ SOLN
3.0000 mL | Freq: Two times a day (BID) | INTRAMUSCULAR | Status: DC
  Administered 2012-06-01: 3 mL via INTRAVENOUS

## 2012-05-30 MED ORDER — ACETAMINOPHEN 325 MG PO TABS: 650.0000 mg | ORAL_TABLET | ORAL | Status: DC | PRN

## 2012-05-30 MED ORDER — ACETAMINOPHEN 10 MG/ML IV SOLN
1000.0000 mg | Freq: Four times a day (QID) | INTRAVENOUS | Status: AC
  Administered 2012-05-30 – 2012-05-31 (×3): 1000 mg via INTRAVENOUS
  Filled 2012-05-30 (×4): qty 100

## 2012-05-30 MED ORDER — POTASSIUM CHLORIDE IN NACL 20-0.9 MEQ/L-% IV SOLN
INTRAVENOUS | Status: DC
  Administered 2012-05-30: 17:00:00 via INTRAVENOUS
  Administered 2012-05-31: 1000 mL via INTRAVENOUS
  Administered 2012-05-31 – 2012-06-01 (×2): via INTRAVENOUS
  Filled 2012-05-30 (×7): qty 1000

## 2012-05-30 MED ORDER — FENTANYL CITRATE 0.05 MG/ML IJ SOLN
INTRAMUSCULAR | Status: DC | PRN
  Administered 2012-05-30 (×3): 50 ug via INTRAVENOUS
  Administered 2012-05-30: 100 ug via INTRAVENOUS
  Administered 2012-05-30: 150 ug via INTRAVENOUS

## 2012-05-30 MED ORDER — MORPHINE SULFATE 2 MG/ML IJ SOLN
1.0000 mg | INTRAMUSCULAR | Status: DC | PRN
  Administered 2012-05-30: 2 mg via INTRAVENOUS
  Filled 2012-05-30: qty 1

## 2012-05-30 MED ORDER — MUPIROCIN 2 % EX OINT
TOPICAL_OINTMENT | CUTANEOUS | Status: AC
  Filled 2012-05-30: qty 22

## 2012-05-30 MED ORDER — VANCOMYCIN HCL 1000 MG IV SOLR
1000.0000 mg | INTRAVENOUS | Status: DC | PRN
  Administered 2012-05-30: 1000 mg via INTRAVENOUS

## 2012-05-30 MED ORDER — SODIUM CHLORIDE 0.9 % IJ SOLN: 3.0000 mL | INTRAMUSCULAR | Status: DC | PRN

## 2012-05-30 MED ORDER — CEFTRIAXONE SODIUM 2 G IJ SOLR
2.0000 g | INTRAMUSCULAR | Status: DC
  Administered 2012-05-30: 2 g via INTRAVENOUS
  Filled 2012-05-30 (×2): qty 2

## 2012-05-30 MED ORDER — PROPOFOL 10 MG/ML IV BOLUS
INTRAVENOUS | Status: DC | PRN
  Administered 2012-05-30: 190 mg via INTRAVENOUS

## 2012-05-30 MED ORDER — LIDOCAINE HCL 4 % MT SOLN
OROMUCOSAL | Status: DC | PRN
  Administered 2012-05-30: 4 mL via TOPICAL

## 2012-05-30 MED ORDER — CYCLOBENZAPRINE HCL 10 MG PO TABS
10.0000 mg | ORAL_TABLET | Freq: Three times a day (TID) | ORAL | Status: DC | PRN
  Administered 2012-05-31 – 2012-06-01 (×5): 10 mg via ORAL
  Filled 2012-05-30 (×5): qty 1

## 2012-05-30 MED ORDER — ACETAMINOPHEN 650 MG RE SUPP: 650.0000 mg | RECTAL | Status: DC | PRN

## 2012-05-30 MED ORDER — ZOLPIDEM TARTRATE 5 MG PO TABS: 5.0000 mg | ORAL_TABLET | Freq: Every evening | ORAL | Status: DC | PRN

## 2012-05-30 MED ORDER — ACETAMINOPHEN 10 MG/ML IV SOLN
INTRAVENOUS | Status: AC
  Filled 2012-05-30: qty 100

## 2012-05-30 MED ORDER — ONDANSETRON HCL 4 MG/2ML IJ SOLN: 4.0000 mg | INTRAMUSCULAR | Status: DC | PRN

## 2012-05-30 MED ORDER — ROCURONIUM BROMIDE 100 MG/10ML IV SOLN
INTRAVENOUS | Status: DC | PRN
  Administered 2012-05-30: 50 mg via INTRAVENOUS

## 2012-05-30 MED ORDER — VITAMIN D3 25 MCG (1000 UNIT) PO TABS
1000.0000 [IU] | ORAL_TABLET | Freq: Every day | ORAL | Status: DC
  Administered 2012-05-30 – 2012-06-02 (×4): 1000 [IU] via ORAL
  Filled 2012-05-30 (×5): qty 1

## 2012-05-30 MED ORDER — VANCOMYCIN HCL IN DEXTROSE 1-5 GM/200ML-% IV SOLN
1000.0000 mg | Freq: Two times a day (BID) | INTRAVENOUS | Status: DC
  Filled 2012-05-30 (×2): qty 200

## 2012-05-30 MED ORDER — HYOSCYAMINE SULFATE 0.125 MG SL SUBL: 0.1250 mg | SUBLINGUAL_TABLET | SUBLINGUAL | Status: DC | PRN

## 2012-05-30 MED ORDER — SENNA 8.6 MG PO TABS
1.0000 | ORAL_TABLET | Freq: Two times a day (BID) | ORAL | Status: DC
  Administered 2012-05-30 – 2012-06-02 (×6): 8.6 mg via ORAL
  Filled 2012-05-30 (×8): qty 1

## 2012-05-30 MED ORDER — SODIUM CHLORIDE 0.9 % IR SOLN
Status: DC | PRN
  Administered 2012-05-30 (×2)

## 2012-05-30 MED ORDER — DULOXETINE HCL 60 MG PO CPEP
60.0000 mg | ORAL_CAPSULE | Freq: Every day | ORAL | Status: DC
  Administered 2012-05-30 – 2012-06-01 (×3): 60 mg via ORAL
  Filled 2012-05-30 (×4): qty 1

## 2012-05-30 MED ORDER — OXYCODONE-ACETAMINOPHEN 5-325 MG PO TABS
1.0000 | ORAL_TABLET | ORAL | Status: DC | PRN
  Administered 2012-05-30 – 2012-06-01 (×7): 2 via ORAL
  Filled 2012-05-30 (×7): qty 2

## 2012-05-30 MED ORDER — HYDROMORPHONE HCL PF 1 MG/ML IJ SOLN
INTRAMUSCULAR | Status: AC
  Filled 2012-05-30: qty 1

## 2012-05-30 MED ORDER — SODIUM CHLORIDE 0.9 % IV SOLN: 250.0000 mL | INTRAVENOUS | Status: DC

## 2012-05-30 MED ORDER — MENTHOL 3 MG MT LOZG: 1.0000 | LOZENGE | OROMUCOSAL | Status: DC | PRN

## 2012-05-30 MED ORDER — VANCOMYCIN HCL IN DEXTROSE 1-5 GM/200ML-% IV SOLN
INTRAVENOUS | Status: AC
  Filled 2012-05-30: qty 200

## 2012-05-30 MED ORDER — LIDOCAINE HCL (CARDIAC) 20 MG/ML IV SOLN
INTRAVENOUS | Status: DC | PRN
  Administered 2012-05-30: 40 mg via INTRAVENOUS

## 2012-05-30 MED ORDER — BACITRACIN 50000 UNITS IM SOLR
INTRAMUSCULAR | Status: AC
  Filled 2012-05-30: qty 1

## 2012-05-30 MED ORDER — SODIUM CHLORIDE 0.9 % IV SOLN
INTRAVENOUS | Status: AC
  Filled 2012-05-30: qty 500

## 2012-05-30 MED ORDER — MIDAZOLAM HCL 5 MG/5ML IJ SOLN
INTRAMUSCULAR | Status: DC | PRN
  Administered 2012-05-30: 2 mg via INTRAVENOUS

## 2012-05-30 MED ORDER — HYDROMORPHONE HCL PF 1 MG/ML IJ SOLN
0.2500 mg | INTRAMUSCULAR | Status: DC | PRN
  Administered 2012-05-30 (×2): 0.5 mg via INTRAVENOUS

## 2012-05-30 MED ORDER — ONDANSETRON HCL 4 MG/2ML IJ SOLN: 4.0000 mg | Freq: Once | INTRAMUSCULAR | Status: DC | PRN

## 2012-05-30 MED ORDER — MENTHOL 3 MG MT LOZG
1.0000 | LOZENGE | Freq: Once | OROMUCOSAL | Status: DC
  Filled 2012-05-30: qty 9

## 2012-05-30 MED ORDER — RIFAMPIN 300 MG PO CAPS
300.0000 mg | ORAL_CAPSULE | Freq: Two times a day (BID) | ORAL | Status: DC
  Administered 2012-05-30 – 2012-06-02 (×7): 300 mg via ORAL
  Filled 2012-05-30 (×9): qty 1

## 2012-05-30 MED ORDER — DEXTROSE 5 % IV SOLN
1.0000 g | INTRAVENOUS | Status: DC
  Filled 2012-05-30: qty 10

## 2012-05-30 MED ORDER — 0.9 % SODIUM CHLORIDE (POUR BTL) OPTIME
TOPICAL | Status: DC | PRN
  Administered 2012-05-30: 1000 mL

## 2012-05-30 MED ORDER — ACETAMINOPHEN 10 MG/ML IV SOLN
1000.0000 mg | Freq: Once | INTRAVENOUS | Status: AC | PRN
  Administered 2012-05-30: 1000 mg via INTRAVENOUS

## 2012-05-30 MED ORDER — BUPROPION HCL 100 MG PO TABS
100.0000 mg | ORAL_TABLET | Freq: Every day | ORAL | Status: DC
  Filled 2012-05-30: qty 1

## 2012-05-30 MED ORDER — BUPROPION HCL ER (SR) 150 MG PO TB12
150.0000 mg | ORAL_TABLET | Freq: Every day | ORAL | Status: DC
  Administered 2012-05-30 – 2012-06-02 (×4): 150 mg via ORAL
  Filled 2012-05-30 (×4): qty 1

## 2012-05-30 MED ORDER — VANCOMYCIN HCL IN DEXTROSE 1-5 GM/200ML-% IV SOLN
1000.0000 mg | Freq: Two times a day (BID) | INTRAVENOUS | Status: DC
  Administered 2012-05-30 – 2012-05-31 (×3): 1000 mg via INTRAVENOUS
  Filled 2012-05-30 (×4): qty 200

## 2012-05-30 MED ORDER — LACTATED RINGERS IV SOLN
INTRAVENOUS | Status: DC | PRN
  Administered 2012-05-30 (×2): via INTRAVENOUS

## 2012-05-30 MED ORDER — MUPIROCIN 2 % EX OINT
TOPICAL_OINTMENT | Freq: Once | CUTANEOUS | Status: DC
  Filled 2012-05-30: qty 22

## 2012-05-30 MED ORDER — PHENOL 1.4 % MT LIQD: 1.0000 | OROMUCOSAL | Status: DC | PRN

## 2012-05-30 SURGICAL SUPPLY — 52 items
APL SKNCLS STERI-STRIP NONHPOA (GAUZE/BANDAGES/DRESSINGS) ×1
BAG DECANTER FOR FLEXI CONT (MISCELLANEOUS) ×4 IMPLANT
BENZOIN TINCTURE PRP APPL 2/3 (GAUZE/BANDAGES/DRESSINGS) ×2 IMPLANT
CANISTER SUCTION 2500CC (MISCELLANEOUS) ×2 IMPLANT
CLOTH BEACON ORANGE TIMEOUT ST (SAFETY) ×2 IMPLANT
DRAPE LAPAROTOMY 100X72X124 (DRAPES) ×2 IMPLANT
DRAPE POUCH INSTRU U-SHP 10X18 (DRAPES) ×2 IMPLANT
DRESSING TELFA 8X3 (GAUZE/BANDAGES/DRESSINGS) ×2 IMPLANT
DRSG OPSITE 4X5.5 SM (GAUZE/BANDAGES/DRESSINGS) ×3 IMPLANT
DURAPREP 26ML APPLICATOR (WOUND CARE) IMPLANT
DURAPREP 6ML APPLICATOR 50/CS (WOUND CARE) ×1 IMPLANT
ELECT CAUTERY BLADE 6.4 (BLADE) ×1 IMPLANT
ELECT REM PT RETURN 9FT ADLT (ELECTROSURGICAL) ×2
ELECTRODE REM PT RTRN 9FT ADLT (ELECTROSURGICAL) ×1 IMPLANT
EVACUATOR 1/8 PVC DRAIN (DRAIN) ×1 IMPLANT
GAUZE SPONGE 4X4 16PLY XRAY LF (GAUZE/BANDAGES/DRESSINGS) IMPLANT
GLOVE BIO SURGEON STRL SZ8 (GLOVE) ×2 IMPLANT
GLOVE BIOGEL PI IND STRL 7.5 (GLOVE) IMPLANT
GLOVE BIOGEL PI IND STRL 8.5 (GLOVE) IMPLANT
GLOVE BIOGEL PI INDICATOR 7.5 (GLOVE) ×1
GLOVE BIOGEL PI INDICATOR 8.5 (GLOVE) ×1
GLOVE ECLIPSE 7.5 STRL STRAW (GLOVE) ×1 IMPLANT
GLOVE SURG SS PI 8.0 STRL IVOR (GLOVE) ×1 IMPLANT
GOWN BRE IMP SLV AUR LG STRL (GOWN DISPOSABLE) IMPLANT
GOWN BRE IMP SLV AUR XL STRL (GOWN DISPOSABLE) ×2 IMPLANT
GOWN STRL REIN 2XL LVL4 (GOWN DISPOSABLE) ×2 IMPLANT
KIT BASIN OR (CUSTOM PROCEDURE TRAY) ×2 IMPLANT
KIT ROOM TURNOVER OR (KITS) ×2 IMPLANT
NDL HYPO 18GX1.5 BLUNT FILL (NEEDLE) IMPLANT
NDL HYPO 25X1 1.5 SAFETY (NEEDLE) ×1 IMPLANT
NDL SPNL 20GX3.5 QUINCKE YW (NEEDLE) IMPLANT
NEEDLE HYPO 18GX1.5 BLUNT FILL (NEEDLE) IMPLANT
NEEDLE HYPO 25X1 1.5 SAFETY (NEEDLE) ×2 IMPLANT
NEEDLE SPNL 20GX3.5 QUINCKE YW (NEEDLE) IMPLANT
NS IRRIG 1000ML POUR BTL (IV SOLUTION) ×2 IMPLANT
PACK LAMINECTOMY NEURO (CUSTOM PROCEDURE TRAY) ×2 IMPLANT
PAD ARMBOARD 7.5X6 YLW CONV (MISCELLANEOUS) ×6 IMPLANT
SENSORCAINE0.25% IMPLANT
SPONGE GAUZE 4X4 12PLY (GAUZE/BANDAGES/DRESSINGS) ×1 IMPLANT
STRIP CLOSURE SKIN 1/2X4 (GAUZE/BANDAGES/DRESSINGS) ×3 IMPLANT
SUT VIC AB 0 CT1 18XCR BRD8 (SUTURE) ×1 IMPLANT
SUT VIC AB 0 CT1 8-18 (SUTURE) ×4
SUT VIC AB 2-0 CP2 18 (SUTURE) ×2 IMPLANT
SUT VIC AB 3-0 SH 8-18 (SUTURE) ×2 IMPLANT
SWAB COLLECTION DEVICE MRSA (MISCELLANEOUS) ×2 IMPLANT
SWAB CULTURE LIQ STUART DBL (MISCELLANEOUS) ×2 IMPLANT
SYR 20ML ECCENTRIC (SYRINGE) ×2 IMPLANT
SYR 3ML LL SCALE MARK (SYRINGE) IMPLANT
TOWEL OR 17X24 6PK STRL BLUE (TOWEL DISPOSABLE) ×2 IMPLANT
TOWEL OR 17X26 10 PK STRL BLUE (TOWEL DISPOSABLE) ×2 IMPLANT
TUBE ANAEROBIC SPECIMEN COL (MISCELLANEOUS) ×2 IMPLANT
WATER STERILE IRR 1000ML POUR (IV SOLUTION) ×2 IMPLANT

## 2012-05-30 NOTE — Anesthesia Preprocedure Evaluation (Signed)
Anesthesia Evaluation  Patient identified by MRN, date of birth, ID band Patient awake    Reviewed: Allergy & Precautions, H&P , NPO status , Patient's Chart, lab work & pertinent test results  Airway Mallampati: I      Dental  (+) Teeth Intact and Dental Advisory Given   Pulmonary  breath sounds clear to auscultation        Cardiovascular Rhythm:Regular Rate:Normal     Neuro/Psych    GI/Hepatic   Endo/Other    Renal/GU      Musculoskeletal   Abdominal   Peds  Hematology   Anesthesia Other Findings   Reproductive/Obstetrics                           Anesthesia Physical Anesthesia Plan  ASA: II  Anesthesia Plan: General   Post-op Pain Management:    Induction: Intravenous  Airway Management Planned: Oral ETT  Additional Equipment:   Intra-op Plan:   Post-operative Plan: Extubation in OR  Informed Consent: I have reviewed the patients History and Physical, chart, labs and discussed the procedure including the risks, benefits and alternatives for the proposed anesthesia with the patient or authorized representative who has indicated his/her understanding and acceptance.   Dental advisory given  Plan Discussed with: CRNA and Surgeon  Anesthesia Plan Comments: (S/P lumbar fusion 05/17/12 now with post-op wound infection. Obesity GERD  Kipp Brood, MD)        Anesthesia Quick Evaluation

## 2012-05-30 NOTE — Anesthesia Postprocedure Evaluation (Signed)
  Anesthesia Post-op Note  Patient: Christine Liu  Procedure(s) Performed: Procedure(s) (LRB) with comments: LUMBAR WOUND DEBRIDEMENT (Bilateral) - Lumbar wound debridement  Patient Location: PACU  Anesthesia Type: General  Level of Consciousness: awake, alert  and oriented  Airway and Oxygen Therapy: Patient Spontanous Breathing and Patient connected to nasal cannula oxygen  Post-op Pain: mild  Post-op Assessment: Post-op Vital signs reviewed and Patient's Cardiovascular Status Stable  Post-op Vital Signs: stable  Complications: No apparent anesthesia complications

## 2012-05-30 NOTE — Progress Notes (Signed)
ANTIBIOTIC CONSULT NOTE - INITIAL  Pharmacy Consult for Vancomycin, Rocephin Indication: lumbar wound infection  No Known Allergies  Patient Measurements:    Body Weight: 85.5 kg  Vital Signs: Temp: 99.8 F (37.7 C) (09/25 1501) Temp src: Oral (09/25 1100) BP: 112/53 mmHg (09/25 1439) Pulse Rate: 111  (09/25 1501) Intake/Output from previous day:   Intake/Output from this shift: Total I/O In: 1015 [I.V.:1000; Other:15] Out: 50 [Blood:50]  Labs: No results found for this basename: WBC:3,HGB:3,PLT:3,LABCREA:3,CREATININE:3 in the last 72 hours The CrCl is unknown because both a height and weight (above a minimum accepted value) are required for this calculation. No results found for this basename: VANCOTROUGH:2,VANCOPEAK:2,VANCORANDOM:2,GENTTROUGH:2,GENTPEAK:2,GENTRANDOM:2,TOBRATROUGH:2,TOBRAPEAK:2,TOBRARND:2,AMIKACINPEAK:2,AMIKACINTROU:2,AMIKACIN:2, in the last 72 hours   Microbiology: Recent Results (from the past 720 hour(s))  SURGICAL PCR SCREEN     Status: Normal   Collection Time   05/30/12 11:34 AM      Component Value Range Status Comment   MRSA, PCR NEGATIVE  NEGATIVE Final    Staphylococcus aureus NEGATIVE  NEGATIVE Final   GRAM STAIN     Status: Normal   Collection Time   05/30/12 12:56 PM      Component Value Range Status Comment   Specimen Description ABSCESS   Final    Special Requests LUMBAR   Final    Gram Stain     Final    Value: ABUNDANT WBC PRESENT,BOTH PMN AND MONONUCLEAR     ABUNDANT GRAM POSITIVE COCCI IN PAIRS IN CLUSTERS     Gram Stain Report Called to,Read Back By and Verified With: Dennie Bible MD 13:25 05/30/12 ([email or web address])   Report Status 05/30/2012 FINAL   Final     Medical History: Past Medical History  Diagnosis Date  . Arthritis   . Hyperlipidemia     doesn't require meds at present  . Chronic back pain     fractured vertebrae in 2 places   . Depression     takes Wellbutrin daily  . Anxiety     takes Cymbalta daily  . Insomnia    related to back pain    Medications:  Prescriptions prior to admission  Medication Sig Dispense Refill  . buPROPion (WELLBUTRIN) 100 MG tablet Take 100 mg by mouth daily.      . cholecalciferol (VITAMIN D) 1000 UNITS tablet Take 1,000 Units by mouth daily.      . cyclobenzaprine (FLEXERIL) 10 MG tablet Take 10 mg by mouth 3 (three) times daily as needed. For muscle spasms      . DULoxetine (CYMBALTA) 60 MG capsule Take 60 mg by mouth at bedtime.      . hyoscyamine (LEVSIN SL) 0.125 MG SL tablet Place 0.125 mg under the tongue every 4 (four) hours as needed. For cramping      . oxyCODONE-acetaminophen (PERCOCET/ROXICET) 5-325 MG per tablet Take 1-2 tablets by mouth every 4 (four) hours as needed. For pain      . hyoscyamine (LEVSIN SL) 0.125 MG SL tablet Place 0.125 mg under the tongue every 4 (four) hours as needed. For cramping      . hyoscyamine (LEVSIN/SL) 0.125 MG SL tablet Place 1 tablet (0.125 mg total) under the tongue every 4 (four) hours as needed for cramping.  30 tablet  0   Assessment: 46 yo lady 2 weeks s/p PLIF at L4-5 admitted today for I&D lumbar wound.  She is to start vancomycin and ceftriaxone per pharmacy.  Goal of Therapy:  Vancomycin trough level 10-15 mcg/ml  Plan:  Vancomycin 1  gm IV q12 hours. Rocephin 2 gm IV q24 hours Will f/u renal function, cultures and clinical progress. Check Css vanc trough when appropriate.  Talbert Cage Poteet 05/30/2012,3:48 PM

## 2012-05-30 NOTE — Op Note (Signed)
05/30/2012  1:10 PM  PATIENT:  Christine Liu  46 y.o. female  PRE-OPERATIVE DIAGNOSIS:  Lumbar wound infection  POST-OPERATIVE DIAGNOSIS:  Same  PROCEDURE:  Irrigation and debridement of lumbar wound  SURGEON:  Marikay Alar, MD  ASSISTANTS: None  ANESTHESIA:   General  EBL: Less than 50 ml  Total I/O In: -  Out: 50 [Blood:50]  BLOOD ADMINISTERED:none  DRAINS: 2 medium Hemovac drains   SPECIMEN:  Source of Specimen:  Gram stain culture of wound  INDICATION FOR PROCEDURE: This patient is status post PLIF 2 weeks ago. She began to have drainage from her wound and fever 2 days ago.  I felt the wound had a deep infection and recommended irrigation and debridement of the wound. Patient understood the risks, benefits, and alternatives and potential outcomes and wished to proceed.  PROCEDURE DETAILS: The patient was taken to the operating room and after induction of adequate generalized endotracheal anesthesia she was rolled into position on the Wilson frame. her lumbar region was cleaned and prepped with DuraPrep and draped in the usual sterile fashion. I opened her old incision with immediate release of old blood and some exudate under pressure. Cultures were then sent and she was started on vancomycin and Rocephin. I then opened the wound further. He was large amounts of hematoma that was removed. There was obvious exudate of fluid within this. I irrigated with about 3 L of bacitracin-containing saline solution. I inspected the dura and the grafts and the screws. I irrigated and debrided until there was clean healthy-looking tissues. I have a feeling the infection started in the suprafascial space over the right iliac crest were harvested graft for the surgery. This was cleaned and irrigated. I left 2 separate drains. I left one. In that suprafascial space, and a left 1 drain in the epidural space. I closed the muscle and the fascia with 0 Vicryl. I closed the subcutaneous tissues and 2-0  Vicryl. Subcuticular tissues were closed with 3-0 Vicryl. The skin was closed with benzoin Steri-Strips. Hemovac drains were sewn into position. She was then awakened from general anesthesia and transferred to the recovery condition in stable condition. At the end of the procedure all sponge needle and instrument counts were correct.   PLAN OF CARE: Admit to inpatient   PATIENT DISPOSITION:  PACU - hemodynamically stable.   Delay start of Pharmacological VTE agent (>24hrs) due to surgical blood loss or risk of bleeding:  yes

## 2012-05-30 NOTE — Transfer of Care (Signed)
Immediate Anesthesia Transfer of Care Note  Patient: Christine Liu  Procedure(s) Performed: Procedure(s) (LRB) with comments: LUMBAR WOUND DEBRIDEMENT (Bilateral) - Lumbar wound debridement  Patient Location: PACU  Anesthesia Type: General  Level of Consciousness: awake, alert , oriented and patient cooperative  Airway & Oxygen Therapy: Patient Spontanous Breathing and Patient connected to nasal cannula oxygen  Post-op Assessment: Report given to PACU RN, Post -op Vital signs reviewed and stable, Patient moving all extremities and Patient moving all extremities X 4  Post vital signs: Reviewed and stable  Complications: No apparent anesthesia complications

## 2012-05-30 NOTE — H&P (Signed)
Subjective: Patient is a 46 y.o. female admitted for irrigation and debridement of lumbar wound. Onset of symptoms was a couple of days ago, somewhat worse since that time.  The pain is rated moderate, and is located at the across the lower back only with palpation, no radiation to the legs. She started to have drainage from her wound appeared to be serosanguineous, but she had significant swelling of the wound. I recommended irrigation debridement of the wound with cultures as I suspect she probably has a soft tissue infection now 2 weeks status post PLIF at L4-5.  Past Medical History  Diagnosis Date  . Arthritis   . Hyperlipidemia     doesn't require meds at present  . Chronic back pain     fractured vertebrae in 2 places   . Depression     takes Wellbutrin daily  . Anxiety     takes Cymbalta daily  . Insomnia     related to back pain    Past Surgical History  Procedure Date  . Mandible surgery     8th grade  . Colonoscopy   . Esophagogastroduodenoscopy     Prior to Admission medications   Medication Sig Start Date End Date Taking? Authorizing Provider  buPROPion (WELLBUTRIN) 100 MG tablet Take 100 mg by mouth daily.   Yes Historical Provider, MD  cholecalciferol (VITAMIN D) 1000 UNITS tablet Take 1,000 Units by mouth daily.   Yes Historical Provider, MD  cyclobenzaprine (FLEXERIL) 10 MG tablet Take 10 mg by mouth 3 (three) times daily as needed. For muscle spasms 05/19/12 05/29/12 Yes Tia Alert, MD  DULoxetine (CYMBALTA) 60 MG capsule Take 60 mg by mouth at bedtime.   Yes Historical Provider, MD  hyoscyamine (LEVSIN SL) 0.125 MG SL tablet Place 0.125 mg under the tongue every 4 (four) hours as needed. For cramping   Yes Historical Provider, MD  oxyCODONE-acetaminophen (PERCOCET/ROXICET) 5-325 MG per tablet Take 1-2 tablets by mouth every 4 (four) hours as needed. For pain 05/19/12 05/29/12 Yes Tia Alert, MD  hyoscyamine (LEVSIN SL) 0.125 MG SL tablet Place 0.125 mg under the  tongue every 4 (four) hours as needed. For cramping 11/04/11 11/14/11  Beverley Fiedler, MD  hyoscyamine (LEVSIN/SL) 0.125 MG SL tablet Place 1 tablet (0.125 mg total) under the tongue every 4 (four) hours as needed for cramping. 10/04/11 10/14/11  Beverley Fiedler, MD   No Known Allergies  History  Substance Use Topics  . Smoking status: Former Smoker -- 4 years    Types: Cigarettes    Quit date: 10/03/1986  . Smokeless tobacco: Never Used   Comment: quit many yrs ago  . Alcohol Use: Yes     6 drinks q weekend-beer    Family History  Problem Relation Age of Onset  . Arthritis Mother     RA  . Osteoporosis Mother   . Hyperlipidemia Mother   . Colon cancer Neg Hx   . Stomach cancer Neg Hx   . Hypertension Father   . Hyperlipidemia Father      Review of Systems  Positive ROS: neg  All other systems have been reviewed and were otherwise negative with the exception of those mentioned in the HPI and as above.  Objective: Vital signs in last 24 hours: Temp:  [99.4 F (37.4 C)] 99.4 F (37.4 C) (09/25 1100) Pulse Rate:  [110] 110  (09/25 1100) Resp:  [20] 20  (09/25 1100) BP: (121)/(77) 121/77 mmHg (09/25 1100) SpO2:  [  99 %] 99 % (09/25 1100)  General Appearance: Alert, cooperative, no distress, appears stated age Head: Normocephalic, without obvious abnormality, atraumatic Eyes: PERRL, conjunctiva/corneas clear, EOM's intact, fundi benign, both eyes      Ears: Normal TM's and external ear canals, both ears Throat: Lips, mucosa, and tongue normal; teeth and gums normal Neck: Supple, symmetrical, trachea midline, no adenopathy; thyroid: No enlargement/tenderness/nodules; no carotid bruit or JVD Back: Symmetric, no curvature, ROM normal, no CVA tenderness Lungs: Clear to auscultation bilaterally, respirations unlabored Heart: Regular rate and rhythm, S1 and S2 normal, no murmur, rub or gallop Abdomen: Soft, non-tender, bowel sounds active all four quadrants, no masses, no  organomegaly Extremities: Extremities normal, atraumatic, no cyanosis or edema Pulses: 2+ and symmetric all extremities Skin: Skin color, texture, turgor normal, no rashes or lesions  NEUROLOGIC:   Mental status: Alert and oriented x4,  no aphasia, good attention span, fund of knowledge, and memory Motor Exam - grossly normal Sensory Exam - grossly normal Reflexes: 1+ Coordination - grossly normal Gait - grossly normal Balance - grossly normal Cranial Nerves: I: smell Not tested  II: visual acuity  OS: nl    OD: nl  II: visual fields Full to confrontation  II: pupils Equal, round, reactive to light  III,VII: ptosis None  III,IV,VI: extraocular muscles  Full ROM  V: mastication Normal  V: facial light touch sensation  Normal  V,VII: corneal reflex  Present  VII: facial muscle function - upper  Normal  VII: facial muscle function - lower Normal  VIII: hearing Not tested  IX: soft palate elevation  Normal  IX,X: gag reflex Present  XI: trapezius strength  5/5  XI: sternocleidomastoid strength 5/5  XI: neck flexion strength  5/5  XII: tongue strength  Normal    Data Review Lab Results  Component Value Date   WBC 5.6 05/17/2012   HGB 12.7 05/17/2012   HCT 37.0 05/17/2012   MCV 92.7 05/17/2012   PLT 182 05/17/2012   Lab Results  Component Value Date   NA 140 05/17/2012   K 4.3 05/17/2012   CL 106 05/17/2012   CO2 26 05/17/2012   BUN 17 05/17/2012   CREATININE 0.90 05/17/2012   GLUCOSE 97 05/17/2012   Lab Results  Component Value Date   INR 0.91 05/17/2012    Assessment/Plan: Patient admitted for I&D of lumbar wound for suspected wound infection or hematoma or seroma.  I explained the condition and procedure to the patient and answered any questions.  Patient wishes to proceed with procedure as planned. Understands risks/ benefits and typical outcomes of procedure.   Christine Liu S 05/30/2012 11:39 AM

## 2012-05-31 ENCOUNTER — Encounter (HOSPITAL_COMMUNITY): Payer: Self-pay | Admitting: Neurological Surgery

## 2012-05-31 MED ORDER — VANCOMYCIN HCL IN DEXTROSE 1-5 GM/200ML-% IV SOLN
1000.0000 mg | Freq: Two times a day (BID) | INTRAVENOUS | Status: DC
  Administered 2012-06-01 (×2): 1000 mg via INTRAVENOUS
  Filled 2012-05-31 (×3): qty 200

## 2012-05-31 MED ORDER — SODIUM CHLORIDE 0.9 % IJ SOLN
10.0000 mL | INTRAMUSCULAR | Status: DC | PRN
  Administered 2012-06-01 – 2012-06-02 (×2): 10 mL

## 2012-05-31 MED ORDER — BISACODYL 10 MG RE SUPP
10.0000 mg | Freq: Every day | RECTAL | Status: DC | PRN
  Administered 2012-05-31: 10 mg via RECTAL
  Filled 2012-05-31: qty 1

## 2012-05-31 MED FILL — Mupirocin Oint 2%: CUTANEOUS | Qty: 22 | Status: AC

## 2012-05-31 NOTE — Progress Notes (Signed)
Peripherally Inserted Central Catheter/Midline Placement  The IV Nurse has discussed with the patient and/or persons authorized to consent for the patient, the purpose of this procedure and the potential benefits and risks involved with this procedure.  The benefits include less needle sticks, lab draws from the catheter and patient may be discharged home with the catheter.  Risks include, but not limited to, infection, bleeding, blood clot (thrombus formation), and puncture of an artery; nerve damage and irregular heat beat.  Alternatives to this procedure were also discussed.  PICC/Midline Placement Documentation  PICC / Midline Single Lumen 05/31/12 PICC Right Basilic (Active)  Indication for Insertion or Continuance of Line Prolonged intravenous therapies 05/31/2012  9:15 AM  Length mark (cm) 1 cm 05/31/2012  9:15 AM  Site Assessment Clean;Dry;Intact 05/31/2012 12:00 PM  Line Status Infusing 05/31/2012 12:00 PM  Dressing Type Transparent 05/31/2012 12:00 PM  Dressing Status Clean;Dry;Intact 05/31/2012 12:00 PM       Gisela Lea, Benetta Spar 05/31/2012, 1:06 PM

## 2012-05-31 NOTE — Care Management Note (Signed)
    Page 1 of 1   05/31/2012     2:17:20 PM   CARE MANAGEMENT NOTE 05/31/2012  Patient:  Christine Liu, Christine Liu   Account Number:  [id number]  Date Initiated:  05/31/2012  Documentation initiated by:  Onnie Boer  Subjective/Objective Assessment:   PT WAS ADMITTED WITH BACK PAIN     Action/Plan:   PROGRESSION OF CARE AND DISCHARGE PLANNING   Anticipated DC Date:  06/02/2012   Anticipated DC Plan:  HOME W HOME HEALTH SERVICES      DC Planning Services  CM consult      Choice offered to / List presented to:             Status of service:  In process, will continue to follow Medicare Important Message given?   (If response is "NO", the following Medicare IM given date fields will be blank) Date Medicare IM given:   Date Additional Medicare IM given:    Discharge Disposition:    Per UR Regulation:  Reviewed for med. necessity/level of care/duration of stay  If discussed at Long Length of Stay Meetings, dates discussed:    Comments:  05/31/12 Onnie Boer, RN, BSN 1410  PT WAS ADMITTED WITH BACK PAIN AND HAS HAD AN I&D.  PTA PT WAS AT HOME WITH SELF CARE.  PT WILL MORE THAN LIKELY NEED HM IV ABX.  WILL F/U.

## 2012-05-31 NOTE — Progress Notes (Signed)
Patient ID: Christine Liu, female   DOB: 05/09/1966, 46 y.o.   MRN: [id number] Subjective: Patient reports she had fevers last night. No leg pain. Back sore.  Objective: Vital signs in last 24 hours: Temp:  [98.1 F (36.7 C)-101.6 F (38.7 C)] 98.1 F (36.7 C) (09/26 0621) Pulse Rate:  [95-124] 114  (09/26 0621) Resp:  [12-20] 20  (09/26 0621) BP: (98-121)/(52-77) 101/63 mmHg (09/26 0621) SpO2:  [95 %-100 %] 97 % (09/26 0621) Weight:  [85.5 kg (188 lb 7.9 oz)] 85.5 kg (188 lb 7.9 oz) (09/26 0158)  Intake/Output from previous day: 09/25 0701 - 09/26 0700 In: 1015 [I.V.:1000] Out: 50 [Blood:50] Intake/Output this shift:    Neurologic: Grossly normal  Lab Results: Lab Results  Component Value Date   WBC 5.6 05/17/2012   HGB 12.7 05/17/2012   HCT 37.0 05/17/2012   MCV 92.7 05/17/2012   PLT 182 05/17/2012   Lab Results  Component Value Date   INR 0.91 05/17/2012   BMET Lab Results  Component Value Date   NA 140 05/17/2012   K 4.3 05/17/2012   CL 106 05/17/2012   CO2 26 05/17/2012   GLUCOSE 97 05/17/2012   BUN 17 05/17/2012   CREATININE 0.90 05/17/2012   CALCIUM 8.9 05/17/2012    Studies/Results: No results found.  Assessment/Plan: Continue abx, await cxs, PICC   LOS: 1 day    Halcyon Heck S 05/31/2012, 8:50 AM

## 2012-06-01 LAB — VANCOMYCIN, TROUGH: Vancomycin Tr: <5 ug/mL — ABNORMAL LOW (ref 10.0–20.0)

## 2012-06-01 MED ORDER — VANCOMYCIN HCL IN DEXTROSE 1-5 GM/200ML-% IV SOLN
1000.0000 mg | Freq: Three times a day (TID) | INTRAVENOUS | Status: DC
  Administered 2012-06-02 (×2): 1000 mg via INTRAVENOUS
  Filled 2012-06-01 (×3): qty 200

## 2012-06-01 NOTE — Progress Notes (Signed)
Pt c/o constipation. Order received for dulcolax suppository. Pt had BM. Reports much relief.

## 2012-06-01 NOTE — Progress Notes (Signed)
Patient ID: Christine Liu, female   DOB: 12-21-1965, 46 y.o.   MRN: [id number] Subjective: Patient reports occasional "Cramp" L hip and leg. Otherwise, no pain except back soreness.   Objective: Vital signs in last 24 hours: Temp:  [97.9 F (36.6 C)-99.4 F (37.4 C)] 97.9 F (36.6 C) (09/27 0602) Pulse Rate:  [102-121] 102  (09/27 0602) Resp:  [18-20] 18  (09/27 0602) BP: (100-113)/(59-70) 105/60 mmHg (09/27 0602) SpO2:  [95 %-99 %] 96 % (09/27 0602)  Intake/Output from previous day: 09/26 0701 - 09/27 0700 In: 1235 [P.O.:360; I.V.:875] Out: 160 [Drains:160] Intake/Output this shift:    Neurologic: Grossly normal, dressing dry, good strength, looks non-toxic.  Lab Results: Lab Results  Component Value Date   WBC 5.6 05/17/2012   HGB 12.7 05/17/2012   HCT 37.0 05/17/2012   MCV 92.7 05/17/2012   PLT 182 05/17/2012   Lab Results  Component Value Date   INR 0.91 05/17/2012   BMET Lab Results  Component Value Date   NA 140 05/17/2012   K 4.3 05/17/2012   CL 106 05/17/2012   CO2 26 05/17/2012   GLUCOSE 97 05/17/2012   BUN 17 05/17/2012   CREATININE 0.90 05/17/2012   CALCIUM 8.9 05/17/2012    Studies/Results: No results found.  Assessment/Plan: Awaiting final cultures with sensitivities, then home with [facility] antibiotics for 4 weeks. D/c hemovac tomorrow.   LOS: 2 days    Jakyri Brunkhorst S 06/01/2012, 9:43 AM

## 2012-06-01 NOTE — Progress Notes (Signed)
ANTIBIOTIC CONSULT NOTE - FOLLOW UP  Pharmacy Consult for Vancomycin Indication: lumbar wound infection  No Known Allergies  Patient Measurements: Height: 5\' 7"  (170.2 cm) Weight: 188 lb 7.9 oz (85.5 kg) IBW/kg (Calculated) : 61.6    Vital Signs: Temp: 98.7 F (37.1 C) (09/27 1823) Temp src: Oral (09/27 1823) BP: 121/71 mmHg (09/27 1823) Pulse Rate: 116  (09/27 1823) Intake/Output from previous day: 09/26 0701 - 09/27 0700 In: 1235 [P.O.:360; I.V.:875] Out: 160 [Drains:160] Intake/Output from this shift:    Labs: No results found for this basename: WBC:3,HGB:3,PLT:3,LABCREA:3,CREATININE:3, in the last 72 hours Estimated Creatinine Clearance: 88.7 ml/min (by C-G formula based on Cr of 0.9).  Basename 06/01/12 1730  VANCOTROUGH <5.0*  VANCOPEAK --  Drue Dun --  GENTTROUGH --  GENTPEAK --  GENTRANDOM --  TOBRATROUGH --  TOBRAPEAK --  TOBRARND --  AMIKACINPEAK --  AMIKACINTROU --  AMIKACIN --     Microbiology: Recent Results (from the past 720 hour(s))  SURGICAL PCR SCREEN     Status: Normal   Collection Time   05/30/12 11:34 AM      Component Value Range Status Comment   MRSA, PCR NEGATIVE  NEGATIVE Final    Staphylococcus aureus NEGATIVE  NEGATIVE Final   CULTURE, ROUTINE-ABSCESS     Status: Normal (Preliminary result)   Collection Time   05/30/12 12:56 PM      Component Value Range Status Comment   Specimen Description ABSCESS   Final    Special Requests LUMBAR   Final    Gram Stain     Final    Value: ABUNDANT WBC PRESENT,BOTH PMN AND MONONUCLEAR     ABUNDANT GRAM POSITIVE COCCI IN PAIRS     IN CLUSTERS Gram Stain Report Called to,Read Back By and Verified With: Gram Stain Report Called to,Read Back By and Verified With: Donette Larry MD [id number] WILSONM Performed at [facility]   Culture     Final    Value: MODERATE STAPHYLOCOCCUS AUREUS     Note: RIFAMPIN AND GENTAMICIN SHOULD NOT BE USED AS SINGLE DRUGS FOR TREATMENT OF STAPH INFECTIONS.   Report Status PENDING   Incomplete   GRAM STAIN     Status: Normal   Collection Time   05/30/12 12:56 PM      Component Value Range Status Comment   Specimen Description ABSCESS   Final    Special Requests LUMBAR   Final    Gram Stain     Final    Value: ABUNDANT WBC PRESENT,BOTH PMN AND MONONUCLEAR     ABUNDANT GRAM POSITIVE COCCI IN PAIRS IN CLUSTERS     Gram Stain Report Called to,Read Back By and Verified With: Dennie Bible MD 13:25 05/30/12 ([email or web address])   Report Status 05/30/2012 FINAL   Final   ANAEROBIC CULTURE     Status: Normal (Preliminary result)   Collection Time   05/30/12 12:56 PM      Component Value Range Status Comment   Specimen Description ABSCESS   Final    Special Requests LUMBAR   Final    Gram Stain PENDING   Incomplete    Culture     Final    Value: NO ANAEROBES ISOLATED; CULTURE IN PROGRESS FOR 5 DAYS   Report Status PENDING   Incomplete   CULTURE, ROUTINE-ABSCESS     Status: Normal (Preliminary result)   Collection Time   05/30/12 12:58 PM      Component Value Range Status Comment   Specimen Description  ABSCESS   Final    Special Requests LUMBAR   Final    Gram Stain     Final    Value: MODERATE WBC PRESENT,BOTH PMN AND MONONUCLEAR     NO SQUAMOUS EPITHELIAL CELLS SEEN     RARE GRAM POSITIVE COCCI     IN PAIRS IN CLUSTERS   Culture     Final    Value: MODERATE STAPHYLOCOCCUS AUREUS     Note: RIFAMPIN AND GENTAMICIN SHOULD NOT BE USED AS SINGLE DRUGS FOR TREATMENT OF STAPH INFECTIONS.   Report Status PENDING   Incomplete   CULTURE, BLOOD (ROUTINE X 2)     Status: Normal (Preliminary result)   Collection Time   05/31/12  1:14 AM      Component Value Range Status Comment   Specimen Description BLOOD RIGHT ARM   Final    Special Requests BOTTLES DRAWN AEROBIC AND ANAEROBIC 5CC EACH   Final    Culture  Setup Time 05/31/2012 09:05   Final    Culture     Final    Value:        BLOOD CULTURE RECEIVED NO GROWTH TO DATE CULTURE WILL BE HELD FOR 5 DAYS BEFORE ISSUING  A FINAL NEGATIVE REPORT   Report Status PENDING   Incomplete   CULTURE, BLOOD (ROUTINE X 2)     Status: Normal (Preliminary result)   Collection Time   05/31/12  1:20 AM      Component Value Range Status Comment   Specimen Description BLOOD RIGHT HAND   Final    Special Requests BOTTLES DRAWN AEROBIC AND ANAEROBIC 5CC EACH   Final    Culture  Setup Time 05/31/2012 09:05   Final    Culture     Final    Value:        BLOOD CULTURE RECEIVED NO GROWTH TO DATE CULTURE WILL BE HELD FOR 5 DAYS BEFORE ISSUING A FINAL NEGATIVE REPORT   Report Status PENDING   Incomplete     Anti-infectives     Start     Dose/Rate Route Frequency Ordered Stop   06/01/12 0600   vancomycin (VANCOCIN) IVPB 1000 mg/200 mL premix     Comments: FLORES, BOB: cabinet override      1,000 mg over 60 Minutes Intravenous Every 12 hours 05/31/12 1830     05/30/12 2300   cefTRIAXone (ROCEPHIN) 2 g in dextrose 5 % 50 mL IVPB  Status:  Discontinued        2 g 100 mL/hr over 30 Minutes Intravenous Every 24 hours 05/30/12 1556 05/31/12 0852   05/30/12 1800   vancomycin (VANCOCIN) IVPB 1000 mg/200 mL premix  Status:  Discontinued     Comments: FLORES, BOB: cabinet override      1,000 mg Intravenous Every 12 hours 05/30/12 1555 05/31/12 1830   05/30/12 1600   vancomycin (VANCOCIN) IVPB 1000 mg/200 mL premix  Status:  Discontinued     Comments: FLORES, BOB: cabinet override      1,000 mg Intravenous Every 12 hours 05/30/12 1554 05/30/12 1555   05/30/12 1530   rifampin (RIFADIN) capsule 300 mg        300 mg Oral Every 12 hours 05/30/12 1526     05/30/12 1242   bacitracin 16109 UNITS injection     Comments: FREEZE, PATRICIA: cabinet override         05/30/12 1242 05/31/12 0044   05/30/12 1219   bacitracin 50,000 Units in sodium chloride irrigation 0.9 %  500 mL irrigation  Status:  Discontinued          As needed 05/30/12 1250 05/30/12 1322   05/30/12 1200   cefTRIAXone (ROCEPHIN) 1 g in dextrose 5 % 50 mL IVPB  Status:   Discontinued        1 g 100 mL/hr over 30 Minutes Intravenous To Neuro OR-Station #32 05/30/12 1153 05/31/12 0852   05/30/12 1150   vancomycin (VANCOCIN) 1 GM/200ML IVPB  Status:  Discontinued     Comments: FLORES, BOB: cabinet override         05/30/12 1150 05/30/12 1554   05/30/12 1142   bacitracin 40981 UNITS injection     Comments: DAYE, DEVONIA: cabinet override         05/30/12 1142 05/30/12 2344          Assessment: Ms. Grant continues on Vancomycin for S. aureus lumbar wound infection- sensitivities are pending. Pt is afebrile, WBC was wnl at baseline on 9/12. Scr was WNL at that time as well. Vancomycin trough is undetectable- suspect patient is clearing this fast given low age and good renal function.  Goal of Therapy:  Vancomycin trough level 15-20 mcg/ml  Plan:  - Change Vancomycin to 1gm IV q 8h, next dose at [place] - Will monitor cx/spec/sens, renal fn and clinical status daily. - Will plan to re-check trough once at [facility]  Thanks, Alexiana Laverdure K. Allena Katz, PharmD, BCPS.  Clinical Pharmacist Pager [phone]. 06/01/2012 7:45 PM

## 2012-06-02 LAB — CULTURE, ROUTINE-ABSCESS

## 2012-06-02 MED ORDER — HEPARIN SOD (PORK) LOCK FLUSH 100 UNIT/ML IV SOLN
250.0000 [IU] | INTRAVENOUS | Status: DC | PRN
  Administered 2012-06-02: 250 [IU]
  Filled 2012-06-02: qty 3

## 2012-06-02 MED ORDER — HEPARIN SOD (PORK) LOCK FLUSH 100 UNIT/ML IV SOLN
250.0000 [IU] | Freq: Every day | INTRAVENOUS | Status: DC
  Filled 2012-06-02: qty 3

## 2012-06-02 MED ORDER — VANCOMYCIN HCL IN DEXTROSE 1-5 GM/200ML-% IV SOLN: 1000.0000 mg | Freq: Three times a day (TID) | INTRAVENOUS | Status: DC

## 2012-06-02 NOTE — Progress Notes (Signed)
hemovac removed by MD Cabbell  Minor, Yvette Rack

## 2012-06-02 NOTE — Progress Notes (Signed)
Rx for Vanc faxed to [facility]. Chassidy at [facility] verified that it was received. Per case manager, patient is good to go home. Pt instructed to call [facility] when she arrives home.  Minor, Yvette Rack

## 2012-06-02 NOTE — Progress Notes (Signed)
NCM spoke to Unit RN and will fax Rx once available to [facility] for IV abx to [phone] and will call to verify received at [phone]. [facility] contact info added to d/c instruction for pt to call once she arrives home. Faxed facesheet to [facility] make them aware pt is d/c today and left note that Unit RN will fax RX. Isidoro Donning RN CCM Case Mgmt phone [phone]

## 2012-06-02 NOTE — Discharge Summary (Signed)
  BP 133/73  Pulse 100  Temp 98.7 F (37.1 C) (Oral)  Resp 20  Ht 5\' 7"  (1.702 m)  Wt 85.5 kg (188 lb 7.9 oz)  BMI 29.52 kg/m2  SpO2 99%  LMP 05/30/2012 Admitting dx:Wound infection Discharge dx: Wound infection Procedure: Wound debridement Dest: home Meds: Vancomycin x 4weeks Alert and oriented x 4, moving lower extremities well Wound is currently clean and dry Surgeon: Marikay Alar Status: alive and well

## 2012-06-04 LAB — ANAEROBIC CULTURE

## 2012-06-04 LAB — CULTURE, ROUTINE-ABSCESS

## 2012-06-06 LAB — CULTURE, BLOOD (ROUTINE X 2): Culture: NO GROWTH

## 2012-10-20 ENCOUNTER — Other Ambulatory Visit: Payer: Self-pay

## 2013-07-02 ENCOUNTER — Emergency Department (INDEPENDENT_AMBULATORY_CARE_PROVIDER_SITE_OTHER)
Admission: EM | Admit: 2013-07-02 | Discharge: 2013-07-02 | Disposition: A | Discharge status: Home / Self Care | Payer: Self-pay | Source: Home / Self Care | Attending: Family Medicine | Admitting: Family Medicine

## 2013-07-02 ENCOUNTER — Encounter (HOSPITAL_COMMUNITY): Payer: Self-pay | Admitting: Emergency Medicine

## 2013-07-02 DIAGNOSIS — R51 Headache: Secondary | ICD-10-CM

## 2013-07-02 MED ORDER — BUTALBITAL-APAP-CAFFEINE 50-325-40 MG PO TABS: 1.0000 | ORAL_TABLET | Freq: Four times a day (QID) | ORAL | Status: AC | PRN

## 2013-07-02 MED ORDER — IBUPROFEN 600 MG PO TABS: 600.0000 mg | ORAL_TABLET | Freq: Four times a day (QID) | ORAL | Status: DC | PRN
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
## 2013-07-04 NOTE — ED Provider Notes (Signed)
Medical screening examination/treatment/procedure(s) were performed by a resident physician or non-physician practitioner and as the supervising physician I was immediately available for consultation/collaboration.  Trenisha Lafavor, MD   Reon Hunley S Cayle Cordoba, MD 07/04/13 0755 

## 2013-07-11 ENCOUNTER — Other Ambulatory Visit: Payer: Self-pay

## 2014-06-20 ENCOUNTER — Other Ambulatory Visit: Payer: Self-pay

## 2015-07-09 ENCOUNTER — Telehealth: Payer: Self-pay | Admitting: Cardiovascular Disease

## 2015-07-09 NOTE — Telephone Encounter (Signed)
[facility] 08/03[facility]15 with Dr Duke Salviaandolph.  [facility] Dr Leonides Sakeandolph's schedule on 08/03/15. lp

## 2015-08-03 ENCOUNTER — Ambulatory Visit (INDEPENDENT_AMBULATORY_CARE_PROVIDER_SITE_OTHER): Payer: BC Managed Care – PPO | Admitting: Cardiovascular Disease

## 2015-08-03 ENCOUNTER — Encounter: Payer: Self-pay | Admitting: Cardiovascular Disease

## 2015-08-03 VITALS — BP 142/90 | HR 82 | Ht 67.0 in | Wt 205.1 lb

## 2015-08-03 DIAGNOSIS — G459 Transient cerebral ischemic attack, unspecified: Secondary | ICD-10-CM | POA: Diagnosis not present

## 2015-08-03 DIAGNOSIS — E66811 Obesity, class 1: Secondary | ICD-10-CM

## 2015-08-03 DIAGNOSIS — E669 Obesity, unspecified: Secondary | ICD-10-CM | POA: Diagnosis not present

## 2015-08-03 DIAGNOSIS — R079 Chest pain, unspecified: Secondary | ICD-10-CM | POA: Diagnosis not present

## 2015-08-03 HISTORY — DX: Obesity, class 1: E66.811

## 2015-08-03 HISTORY — DX: Obesity, unspecified: E66.9

## 2015-08-03 NOTE — Patient Instructions (Signed)
Medication Instructions:  Your physician recommends that you continue on your current medications as directed. Please refer to the Current Medication list given to you today.  Labwork: none  Testing/Procedures: MRI examination of the brain  MRA WITH CONTRAST OF HEAD AND NECK  Your physician has requested that you have an exercise tolerance test. For further information please visit [email or web address]. Please also follow instruction sheet, as given.  REFERRAL TO NEUROLOGY   Follow-Up: Your physician recommends that you schedule a follow-up appointment in: 2 MONTH OV

## 2015-08-03 NOTE — Progress Notes (Signed)
Cardiology Office Note   Date:  08/03/2015   ID:  Christine Liu, DOB 04/02/1966, MRN [id number]  PCP:  Christine Liu, Christine Liu  Cardiologist:   Christine Liu, Christine Liu   Chief Complaint  Patient presents with  . New Evaluation    pt states no edema, states she was standing talking to some parents and lost focus of the conversation  . Chest Pain    pt states she is having chest pain now,   . Shortness of Breath    some      History of Present Illness: Christine Liu is a 49 y.o. female with hyperlipidemia who presents for an evaluation of atypical chest pain. Christine Liu saw her Christine Pulse, Christine Liu, on 07/08/15.  At that appointment she complained of dyspnea on exertion and atypical chest discomfort.  At that time her EKG was unremarkable.  The chest pain was nonexertional, however she has a family history of premature coronary artery disease.  Therefore, a beta blocker was added to her regimen and she was referred to cardiology for further evaluation. She reports that her first episode on occurred in the spring and awoke her from slleeping. It was associated with extreme shortness of breath and she felt as though she could not catch her breath. There was no associated nausea, vomiting or diaphoresis. The pain was in her left substernal region as well as under her left breast.  Since then she has had constant chest pain that ranges from 2-8 in severity. She's tried taking several different antacids without relief. It is not worse with exertion.  She denies any lower extremity edema, orthopnea or PND. When she first saw Christine Liu in the spring it was felt that her symptoms were due to gastroesophageal reflux disease.  However when she continued to have symptoms despite GERD treatment she was referred to cardiology.  In October Christine Liu was on a trip in [place] when she had an episode of inability to speak. She remembers people talking to her but she was unable to get her words out. The  episode lasted for approximately 1-1/2 minutes.There is no seizure-like activity or loss of bowel or bladder function.  After 2 minutes she was able to communicate normally.Since then she's noticedthat she sometimes daughters has word finding difficulty. She reports that her father had a massive stroke at age 48.  [name] previously drank alcohol heavily. She drank 2-3 12 packs per week.  She stopped drinking in January.  She notes occasional heart palpitations that she can feel in her back.  She notes that this occurs when her blood pressure is elevated.    She does not get any exercise. She does clean her house, which does not worsen her chest pain.   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  Her school has interfered with her ability to exercise.  Past Medical History  Diagnosis Date  . Hyperlipidemia     doesn't require meds at present  . Chronic back pain     fractured vertebrae in 2 places   . Depression     takes Wellbutrin daily  . Anxiety     takes Cymbalta daily  . Insomnia     related to back pain  . Arthritis     "spine, legs" (05/30/2012)  . Osteoporosis   . Obesity (BMI 30.0-34.9) 08/03/2015    Past Surgical History  Procedure Laterality Date  . Mandible surgery  ~  1981  . Colonoscopy    . Esophagogastroduodenoscopy    . Posterior fusion lumbar spine  05/17/2012    L4-5; "put cage in"  . Incision and drainage posterior lumbar spine  05/30/2012    S/P fusion 2 wk ago  . Lumbar wound debridement  05/30/2012    Procedure: LUMBAR WOUND DEBRIDEMENT;  Surgeon: Christine Liu, Christine Liu;  Location: [facility];  Service: Neurosurgery;  Laterality: Bilateral;  Lumbar wound debridement     Current Outpatient Prescriptions  Medication Sig Dispense Refill  . buPROPion (WELLBUTRIN SR) 150 MG 12 hr tablet Take 150 mg by mouth daily.    . celecoxib (CELEBREX) 200 MG capsule Take 200 mg by mouth daily.    . cholecalciferol (VITAMIN D) 1000 UNITS tablet  Take 1,000 Units by mouth daily.    Marland Kitchen. escitalopram (LEXAPRO) 5 MG tablet Take 5 mg by mouth daily.    . LO LOESTRIN FE 1 MG-10 MCG / 10 MCG tablet Take 10 mg by mouth daily.  2  . metoprolol succinate (TOPROL-XL) 50 MG 24 hr tablet Take 50 mg by mouth daily.     No current facility-administered medications for this visit.    Allergies:   Review of patient's allergies indicates no known allergies.    Social History:  The patient  reports that she quit smoking about 28 years ago. Her smoking use included Cigarettes. She has a .48 pack-year smoking history. She has never used smokeless tobacco. She reports that she drinks about 3.6 oz of alcohol per week. She reports that she does not use illicit drugs.   Family History:  The patient's family history includes Arthritis in her mother; Hyperlipidemia in her father and mother; Hypertension in her father; Osteoporosis in her mother. There is no history of Colon cancer or Stomach cancer.    ROS:  Please see the history of present illness.   Otherwise, review of systems are positive for none.   All other systems are reviewed and negative.    PHYSICAL EXAM: VS:  BP 142/90 mmHg  Liu 82  Ht 5\' 7"  (1.702 m)  Wt 93.033 kg (205 lb 1.6 oz)  BMI 32.12 kg/m2 , BMI Body mass index is 32.12 kg/(m^2). GENERAL:  Well appearing HEENT:  Pupils equal round and reactive, fundi not visualized, oral mucosa unremarkable NECK:  No jugular venous distention, waveform within normal limits, carotid upstroke brisk and symmetric, no bruits, no thyromegaly LYMPHATICS:  No cervical adenopathy LUNGS:  Clear to auscultation bilaterally HEART:  RRR.  PMI not displaced or sustained,S1 and S2 within normal limits, no S3, no S4, no clicks, no rubs, no murmurs ABD:  Flat, positive bowel sounds normal in frequency in pitch, no bruits, no rebound, no guarding, no midline pulsatile mass, no hepatomegaly, no splenomegaly EXT:  2 plus pulses throughout, no edema, no cyanosis no  clubbing SKIN:  No rashes no nodules NEURO:  Cranial nerves II through XII grossly intact, motor grossly intact throughout PSYCH:  Cognitively intact, oriented to person place and time    EKG:  EKG is ordered today. The ekg ordered today demonstrates inus rhythm rate 82 bpm. Low voltage in the precordial leads. Nonspecific ST/T changes.   Recent Labs: No results found for requested labs within last 365 days.    Lipid Panel No results found for: CHOL, TRIG, HDL, CHOLHDL, VLDL, LDLCALC, LDLDIRECT   16/09/909/2/16: sodium 139, potassium 4.4, AST 19, ALT 20,glucose 84, BUN 18, creatinine 1.0 WBC 9, hematocrit 40.4, platelets 257  Wt Readings from Last 3 Encounters:  08/03/15 93.033 kg (205 lb 1.6 oz)  05/31/12 85.5 kg (188 lb 7.9 oz)  05/17/12 85.5 kg (188 lb 7.9 oz)      ASSESSMENT AND PLAN:  # Atypical chest pain:  Ms. Free symptoms ar quite atypical.  However, she as risk factors including hyperlipidemia and family history of premature coronary artery disease. Therefore we will refer her for treadmill stress testing to evaluate for ischemia.  # Hypertension: lood pressure slightly elevated.She will keep a log of her blood pressures at home We will not start an antihypertensive at this time.  If her blood pressure is elevated on her home log orif it remains elevated at her follow-upwe will start an antihypertensive.  # Word-finding difficulties: it's unclear what happenedwith her epi This could have been consistent with an absence seizure or a Christine. Her father did have a massive stroke at age 59. I will refer her to neurology and obtain an MRI of the brain with MRA of the head and neck to evaluate for carotid vascular disease or prior stroke.   Current medicines are reviewed at length with the patient today.  The patient does not have concerns regarding medicines.  The following changes have been made:  no change  Labs/ tests ordered today include:  No orders of the defined types  were placed in this encounter.     Disposition:   FU with Radonna Bracher C. Duke Salvia, Christine Liu in 2 months.   Signed, Christine Liu, Christine Liu  08/03/2015 2:56 PM    [facility]

## 2015-08-21 ENCOUNTER — Ambulatory Visit
Admission: RE | Admit: 2015-08-21 | Discharge: 2015-08-21 | Disposition: A | Discharge status: Home / Self Care | Payer: BC Managed Care – PPO | Source: Ambulatory Visit | Attending: Cardiovascular Disease | Admitting: Cardiovascular Disease

## 2015-08-21 DIAGNOSIS — G459 Transient cerebral ischemic attack, unspecified: Secondary | ICD-10-CM

## 2015-08-26 ENCOUNTER — Telehealth (HOSPITAL_COMMUNITY): Payer: Self-pay

## 2015-08-26 NOTE — Telephone Encounter (Signed)
Encounter complete. 

## 2015-09-01 ENCOUNTER — Ambulatory Visit (HOSPITAL_COMMUNITY)
Admission: RE | Admit: 2015-09-01 | Discharge: 2015-09-01 | Disposition: A | Discharge status: Home / Self Care | Payer: BC Managed Care – PPO | Source: Ambulatory Visit | Attending: Cardiovascular Disease | Admitting: Cardiovascular Disease

## 2015-09-01 ENCOUNTER — Encounter (HOSPITAL_COMMUNITY): Payer: Self-pay | Admitting: *Deleted

## 2015-09-01 DIAGNOSIS — R079 Chest pain, unspecified: Secondary | ICD-10-CM | POA: Insufficient documentation

## 2015-09-01 DIAGNOSIS — R9439 Abnormal result of other cardiovascular function study: Secondary | ICD-10-CM | POA: Diagnosis not present

## 2015-09-01 LAB — EXERCISE TOLERANCE TEST
CHL CUP RESTING HR STRESS: 116 {beats}/min
CHL RATE OF PERCEIVED EXERTION: 16
CSEPED: 9 min
CSEPEW: 10.1 METS
MPHR: 172 {beats}/min
Peak HR: 187 {beats}/min
Percent HR: 108 %

## 2015-09-01 NOTE — Progress Notes (Unsigned)
Patient ID: Christine Liu, female   DOB: 12/06/1965, 49 y.o.   MRN: [id number] Patient had approximately 2 mm ST-T changes, reviewed by Dr. Allyson SabalBerry, ok'd to d/c home and she Has an appointment with Dr. Duke Salviaandolph.

## 2015-09-03 ENCOUNTER — Ambulatory Visit
Admission: RE | Admit: 2015-09-03 | Discharge: 2015-09-03 | Disposition: A | Discharge status: Home / Self Care | Payer: BC Managed Care – PPO | Source: Ambulatory Visit | Attending: Cardiovascular Disease | Admitting: Cardiovascular Disease

## 2015-09-03 ENCOUNTER — Encounter: Payer: Self-pay | Admitting: Cardiovascular Disease

## 2015-09-03 ENCOUNTER — Telehealth: Payer: Self-pay | Admitting: *Deleted

## 2015-09-03 DIAGNOSIS — Z01818 Encounter for other preprocedural examination: Secondary | ICD-10-CM

## 2015-09-03 DIAGNOSIS — R9439 Abnormal result of other cardiovascular function study: Secondary | ICD-10-CM

## 2015-09-03 DIAGNOSIS — D689 Coagulation defect, unspecified: Secondary | ICD-10-CM

## 2015-09-03 MED ORDER — GADOBENATE DIMEGLUMINE 529 MG/ML IV SOLN: 19.0000 mL | Freq: Once | INTRAVENOUS | Status: DC | PRN

## 2015-09-03 NOTE — Telephone Encounter (Signed)
Spoke to patient.  Stress testResult given . Verbalized understanding Patient is in agreement to proceed with cardiac cath.   Will schedule cath to do either wed ,thurs., fri. of next week.  Patient aware scheduler will call her with time  LABS AND XRAY ORDERED

## 2015-09-03 NOTE — Telephone Encounter (Signed)
LEFT MESSAGE TO CALL BACK --REGARDING TO STRESS TEST RESULTS

## 2015-09-03 NOTE — Telephone Encounter (Signed)
-----   Message from Tiffany Duncan, MD sent at 09/02/2015  7:53 AM EST ----- Stress test abnormal.  Please refer for cardiac cath.  Follow up with me after cath. 

## 2015-09-03 NOTE — Telephone Encounter (Signed)
-----   Message from Chilton Siiffany Benson, MD sent at 09/02/2015  7:53 AM EST ----- Stress test abnormal.  Please refer for cardiac cath.  Follow up with me after cath.

## 2015-09-04 ENCOUNTER — Other Ambulatory Visit: Payer: Self-pay | Admitting: *Deleted

## 2015-09-04 ENCOUNTER — Telehealth: Payer: Self-pay | Admitting: *Deleted

## 2015-09-04 ENCOUNTER — Ambulatory Visit
Admission: RE | Admit: 2015-09-04 | Discharge: 2015-09-04 | Disposition: A | Discharge status: Home / Self Care | Payer: BC Managed Care – PPO | Source: Ambulatory Visit | Attending: Cardiovascular Disease | Admitting: Cardiovascular Disease

## 2015-09-04 DIAGNOSIS — Z01818 Encounter for other preprocedural examination: Secondary | ICD-10-CM

## 2015-09-04 DIAGNOSIS — R9439 Abnormal result of other cardiovascular function study: Secondary | ICD-10-CM

## 2015-09-04 LAB — CBC
HEMATOCRIT: 39.2 % (ref 36.0–46.0)
HEMOGLOBIN: 13.1 g/dL (ref 12.0–15.0)
MCH: 30.2 pg (ref 26.0–34.0)
MCHC: 33.4 g/dL (ref 30.0–36.0)
MCV: 90.3 fL (ref 78.0–100.0)
MPV: 11.8 fL (ref 8.6–12.4)
Platelets: 259 10*3/uL (ref 150–400)
RBC: 4.34 MIL/uL (ref 3.87–5.11)
RDW: 13.3 % (ref 11.5–15.5)
WBC: 8.6 10*3/uL (ref 4.0–10.5)

## 2015-09-04 LAB — BASIC METABOLIC PANEL
BUN: 19 mg/dL (ref 7–25)
CHLORIDE: 105 mmol/L (ref 98–110)
CO2: 26 mmol/L (ref 20–31)
CREATININE: 0.85 mg/dL (ref 0.50–1.10)
Calcium: 9.2 mg/dL (ref 8.6–10.2)
Glucose, Bld: 79 mg/dL (ref 65–99)
Potassium: 4.5 mmol/L (ref 3.5–5.3)
Sodium: 139 mmol/L (ref 135–146)

## 2015-09-04 NOTE — Telephone Encounter (Signed)
-----   Message from Chilton Siiffany Brookford, MD sent at 09/04/2015  8:03 AM EST ----- MRI does not reveal any prior strokes or blockages in the neck arteries.  Lease keep scheduled follow-up with neurology.

## 2015-09-04 NOTE — Telephone Encounter (Signed)
LEFT DETAILED MESSAGE OWN SECURE VOICEMAIL  ANY QUESTION MAY CALL BACK

## 2015-09-05 LAB — APTT: aPTT: 28 seconds (ref 24–37)

## 2015-09-05 LAB — PROTIME-INR
INR: 0.93 (ref <1.50)
Prothrombin Time: 12.6 seconds (ref 11.6–15.2)

## 2015-09-07 ENCOUNTER — Telehealth: Payer: Self-pay | Admitting: Cardiovascular Disease

## 2015-09-07 NOTE — Telephone Encounter (Signed)
Cardiology Office Note   Date:  09/07/2015   ID:  Christine AndaMary S Liu, DOB [birth year], MRN [id number]  Christine Liu:  Christine Liu,Christine Liu, Christine Liu  Cardiologist:   Christine Liu, Christine Reamer Liu, Christine Liu   No chief complaint on file.     History of Present Illness: Christine AndaMary S Baillargeon is a 50 y.o. female with hyperlipidemia who presents for an evaluation of atypical chest pain. Ms. Christine Liu saw her Christine PulsePCP,Christine Avva, Christine Liu, on 07/08/15.  At that appointment she complained of dyspnea on exertion and atypical chest discomfort.  At that time her EKG was unremarkable.  The chest pain was nonexertional, however she has a family history of premature coronary artery disease.  Therefore, a beta blocker was added to her regimen and she was referred to cardiology for further evaluation. She reports that her first episode on occurred in the spring and awoke her from slleeping. It was associated with extreme shortness of breath and she felt as though she could not catch her breath. There was no associated nausea, vomiting or diaphoresis. The pain was in her left substernal region as well as under her left breast.  Since then she has had constant chest pain that ranges from 2-8 in severity. She's tried taking several different antacids without relief. It is not worse with exertion.  She denies any lower extremity edema, orthopnea or PND. When she first saw Dr. Felipa Liu in the spring it was felt that her symptoms were due to gastroesophageal reflux disease.  However when she continued to have symptoms despite GERD treatment she was referred to cardiology.  In October Christine Liu was on a trip in [place] when she had an episode of inability to speak. She remembers people talking to her but she was unable to get her words out. The episode lasted for approximately 1-1/2 minutes.There is no seizure-like activity or loss of bowel or bladder function.  After 2 minutes she was able to communicate normally.Since then she's noticedthat she sometimes daughters has  word finding difficulty. She reports that her father had a massive stroke at age 49.  Ms. Christine Liu previously drank alcohol heavily. She drank 2-3 12 packs per week.  She stopped drinking in January.  She notes occasional heart palpitations that she can feel in her back.  She notes that this occurs when her blood pressure is elevated.    She does not get any exercise. She does clean her house, which does not worsen her chest pain.   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  Her school has interfered with her ability to exercise.  Past Medical History  Diagnosis Date  . Hyperlipidemia     doesn't require meds at present  . Chronic back pain     fractured vertebrae in 2 places   . Depression     takes Wellbutrin daily  . Anxiety     takes Cymbalta daily  . Insomnia     related to back pain  . Arthritis     "spine, legs" (05/30/2012)  . Osteoporosis   . Obesity (BMI 30.0-34.9) 08/03/2015    Past Surgical History  Procedure Laterality Date  . Mandible surgery  ~ 1981  . Colonoscopy    . Esophagogastroduodenoscopy    . Posterior fusion lumbar spine  05/17/2012    L4-5; "put cage in"  . Incision and drainage posterior lumbar spine  05/30/2012    S/Liu fusion 2 wk ago  . Lumbar wound debridement  05/30/2012  Procedure: LUMBAR WOUND DEBRIDEMENT;  Surgeon: Tia Alert, Christine Liu;  Location: [facility];  Service: Neurosurgery;  Laterality: Bilateral;  Lumbar wound debridement     Current Outpatient Prescriptions  Medication Sig Dispense Refill  . buPROPion (WELLBUTRIN SR) 150 MG 12 hr tablet Take 150 mg by mouth daily.    . celecoxib (CELEBREX) 200 MG capsule Take 200 mg by mouth daily.    . cholecalciferol (VITAMIN D) 1000 UNITS tablet Take 1,000 Units by mouth daily.    Marland Kitchen escitalopram (LEXAPRO) 5 MG tablet Take 5 mg by mouth daily.    . LO LOESTRIN FE 1 MG-10 MCG / 10 MCG tablet Take 10 mg by mouth daily.  2  . metoprolol succinate (TOPROL-XL) 50 MG 24 hr  tablet Take 50 mg by mouth daily.     No current facility-administered medications for this visit.    Allergies:   Review of patient's allergies indicates no known allergies.    Social History:  The patient  reports that she quit smoking about 28 years ago. Her smoking use included Cigarettes. She has a .48 pack-year smoking history. She has never used smokeless tobacco. She reports that she drinks about 3.6 oz of alcohol per week. She reports that she does not use illicit drugs.   Family History:  The patient's family history includes Arthritis in her mother; Hyperlipidemia in her father and mother; Hypertension in her father; Osteoporosis in her mother. There is no history of Colon cancer or Stomach cancer.    ROS:  Please see the history of present illness.   Otherwise, review of systems are positive for none.   All other systems are reviewed and negative.    PHYSICAL EXAM: VS:  BP 142/90 mmHg  Liu 82  Ht 5\' 7"  (1.702 m)  Wt 93.033 kg (205 lb 1.6 oz)  BMI 32.12 kg/m2 , BMI There is no weight on file to calculate BMI. GENERAL:  Well appearing HEENT:  Pupils equal round and reactive, fundi not visualized, oral mucosa unremarkable NECK:  No jugular venous distention, waveform within normal limits, carotid upstroke brisk and symmetric, no bruits, no thyromegaly LYMPHATICS:  No cervical adenopathy LUNGS:  Clear to auscultation bilaterally HEART:  RRR.  PMI not displaced or sustained,S1 and S2 within normal limits, no S3, no S4, no clicks, no rubs, no murmurs ABD:  Flat, positive bowel sounds normal in frequency in pitch, no bruits, no rebound, no guarding, no midline pulsatile mass, no hepatomegaly, no splenomegaly EXT:  2 plus pulses throughout, no edema, no cyanosis no clubbing SKIN:  No rashes no nodules NEURO:  Cranial nerves II through XII grossly intact, motor grossly intact throughout PSYCH:  Cognitively intact, oriented to person place and time    EKG:  EKG is ordered  today. The ekg ordered today demonstrates inus rhythm rate 82 bpm. Low voltage in the precordial leads. Nonspecific ST/T changes.   Recent Labs: 09/03/2015: BUN 19; Creat 0.85; Hemoglobin 13.1; Platelets 259; Potassium 4.5; Sodium 139    Lipid Panel No results found for: CHOL, TRIG, HDL, CHOLHDL, VLDL, LDLCALC, LDLDIRECT   16/1/09: sodium 139, potassium 4.4, AST 19, ALT 20,glucose 84, BUN 18, creatinine 1.0 WBC 9, hematocrit 40.4, platelets 257  Wt Readings from Last 3 Encounters:  09/03/15 92.987 kg (205 lb)  09/03/15 92.987 kg (205 lb)  09/03/15 92.987 kg (205 lb)      ASSESSMENT AND PLAN:  # Atypical chest pain:  Christine Liu symptoms ar quite atypical.  However, she  as risk factors including hyperlipidemia and family history of premature coronary artery disease. Therefore we will refer her for treadmill stress testing to evaluate for ischemia.  # Hypertension: lood pressure slightly elevated.She will keep a log of her blood pressures at home We will not start an antihypertensive at this time.  If her blood pressure is elevated on her home log orif it remains elevated at her follow-upwe will start an antihypertensive.  # Word-finding difficulties: it's unclear what happenedwith her epi This could have been consistent with an absence seizure or a TIA. Her father did have a massive stroke at age 19. I will refer her to neurology and obtain an MRI of the brain with MRA of the head and neck to evaluate for carotid vascular disease or prior stroke.   Current medicines are reviewed at length with the patient today.  The patient does not have concerns regarding medicines.  The following changes have been made:  no change  Labs/ tests ordered today include:  No orders of the defined types were placed in this encounter.     Disposition:   FU with Kaeo Jacome C. Duke Salvia, Christine Liu in 2 months.   Signed, Christine Hook, Christine Liu  09/07/2015 12:13 PM    [facility]    Addendum: Ms. Cullinan underwent exercise treadmill stress testing that was concerning for ischemia. There was 1 mm ST depression. Therefore she is referred for cardiac catheterization. She has expressed understanding and is willing to undergo this procedure. She has normal kidney function and no allergies.  Yatzary Merriweather C. Duke Salvia, Christine Liu, [facility]  09/07/2015 12:13 PM

## 2015-09-08 ENCOUNTER — Encounter: Payer: Self-pay | Admitting: Neurology

## 2015-09-08 ENCOUNTER — Ambulatory Visit (INDEPENDENT_AMBULATORY_CARE_PROVIDER_SITE_OTHER): Payer: BC Managed Care – PPO | Admitting: Neurology

## 2015-09-08 VITALS — BP 134/80 | HR 96 | Ht 67.0 in | Wt 210.3 lb

## 2015-09-08 DIAGNOSIS — G459 Transient cerebral ischemic attack, unspecified: Secondary | ICD-10-CM

## 2015-09-08 DIAGNOSIS — G44219 Episodic tension-type headache, not intractable: Secondary | ICD-10-CM | POA: Diagnosis not present

## 2015-09-08 NOTE — Progress Notes (Signed)
Chart forwarded.  

## 2015-09-08 NOTE — Progress Notes (Addendum)
NEUROLOGY CONSULTATION NOTE  DESHA BITNER MRN: [id number] DOB: 27-Jul-1966  Referring provider: Dr. Duke Salvia Primary care provider: Dr. Felipa Eth  Reason for consult:  TIA  HISTORY OF PRESENT ILLNESS: Christine Liu is a 50 year old right-handed female with hyperlipidemia, chronic back pain and depression and history of prior alcohol abuse who presents for evaluation of TIA.  History obtained by patient and cardiology note.  Labs and imaging of brain MRI and head and neck MRA reviewed.  In October, she was standing with her family when suddenly she was unable to speak.  When other people were talking to her, it sounded like gibberish.  There was no headache, dizziness, focal weakness, facial droop or vision loss.  It lasted one to two minutes.  Since then, she reports headache.  It is either on top or front of head.  It is non-throbbing.  There is some photophobia but no nausea, phonophobia or visual disturbance.  It lasts several hours and occurs once a month.  Tylenol and Advil are ineffective.    MRI of brain from 09/03/15 showed moderately advanced chronic small vessel ischemic changes but no acute or subacute infarcts.  MRA of the head and neck were unremarkable.  She also has been having chest pain since April.  Stress test showed nonspecific ST changes.  She is scheduled for a cath tomorrow.  Her father had a stroke at age 52.  Recent CBC, BMP, PT and INR were normal.  PAST MEDICAL HISTORY: Past Medical History  Diagnosis Date  . Hyperlipidemia     doesn't require meds at present  . Chronic back pain     fractured vertebrae in 2 places   . Depression     takes Wellbutrin daily  . Anxiety     takes Cymbalta daily  . Insomnia     related to back pain  . Arthritis     "spine, legs" (05/30/2012)  . Osteoporosis   . Obesity (BMI 30.0-34.9) 08/03/2015    PAST SURGICAL HISTORY: Past Surgical History  Procedure Laterality Date  . Mandible surgery  ~ 1981  . Colonoscopy    .  Esophagogastroduodenoscopy    . Posterior fusion lumbar spine  05/17/2012    L4-5; "put cage in"  . Incision and drainage posterior lumbar spine  05/30/2012    S/P fusion 2 wk ago  . Lumbar wound debridement  05/30/2012    Procedure: LUMBAR WOUND DEBRIDEMENT;  Surgeon: Tia Alert, MD;  Location: [facility];  Service: Neurosurgery;  Laterality: Bilateral;  Lumbar wound debridement    MEDICATIONS: Current Outpatient Prescriptions on File Prior to Visit  Medication Sig Dispense Refill  . buPROPion (WELLBUTRIN SR) 150 MG 12 hr tablet Take 150 mg by mouth daily.    . celecoxib (CELEBREX) 200 MG capsule Take 200 mg by mouth daily.    . cholecalciferol (VITAMIN D) 1000 UNITS tablet Take 1,000 Units by mouth daily.    Marland Kitchen escitalopram (LEXAPRO) 5 MG tablet Take 5 mg by mouth daily.    . LO LOESTRIN FE 1 MG-10 MCG / 10 MCG tablet Take 10 mg by mouth daily.  2  . metoprolol succinate (TOPROL-XL) 50 MG 24 hr tablet Take 50 mg by mouth daily.     No current facility-administered medications on file prior to visit.    ALLERGIES: No Known Allergies  FAMILY HISTORY: Family History  Problem Relation Age of Onset  . Arthritis Mother     RA  . Osteoporosis Mother   .  Hyperlipidemia Mother   . Colon cancer Neg Hx   . Stomach cancer Neg Hx   . Hypertension Father   . Hyperlipidemia Father     SOCIAL HISTORY: Social History   Social History  . Marital Status: Married    Spouse Name: N/A  . Number of Children: 1  . Years of Education: N/A   Occupational History  . 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 Smoking status: Former Smoker -- 0.12 packs/day for 4 years    Types: Cigarettes    Quit date: 10/03/1986  . Smokeless tobacco: Never Used  . Alcohol Use: 3.6 oz/week    6 Cans of beer per week     Comment: 05/30/2012 "drink only on weekends"  . Drug Use: No  . Sexual Activity: Not Currently   Other Topics Concern  . Not on file   Social History Narrative    Epworth sleepiness scale 15 as of 08/03/15. Pt has college degree.     REVIEW OF SYSTEMS: Constitutional: No fevers, chills, or sweats, no generalized fatigue, change in appetite Eyes: No visual changes, double vision, eye pain Ear, nose and throat: No hearing loss, ear pain, nasal congestion, sore throat Cardiovascular: No chest pain, palpitations Respiratory:  No shortness of breath at rest or with exertion, wheezes GastrointestinaI: No nausea, vomiting, diarrhea, abdominal pain, fecal incontinence Genitourinary:  No dysuria, urinary retention or frequency Musculoskeletal:  No neck pain, back pain Integumentary: No rash, pruritus, skin lesions Neurological: as above Psychiatric: No depression, insomnia, anxiety Endocrine: No palpitations, fatigue, diaphoresis, mood swings, change in appetite, change in weight, increased thirst Hematologic/Lymphatic:  No anemia, purpura, petechiae. Allergic/Immunologic: no itchy/runny eyes, nasal congestion, recent allergic reactions, rashes  PHYSICAL EXAM: Filed Vitals:   09/08/15 1448  BP: 134/80  Pulse: 96   General: No acute distress.  Patient appears well-groomed.  Head:  Normocephalic/atraumatic Eyes:  fundi unremarkable, without vessel changes, exudates, hemorrhages or papilledema. Neck: supple, no paraspinal tenderness, full range of motion Back: No paraspinal tenderness Heart: regular rate and rhythm Lungs: Clear to auscultation bilaterally. Vascular: No carotid bruits. Neurological Exam: Mental status: alert and oriented to person, place, and time, recent and remote memory intact, fund of knowledge intact, attention and concentration intact, speech fluent and not dysarthric, language intact. Cranial nerves: CN I: not tested CN II: pupils equal, round and reactive to light, visual fields intact, fundi unremarkable, without vessel changes, exudates, hemorrhages or papilledema. CN III, IV, VI:  full range of motion, no nystagmus, no  ptosis CN V: facial sensation intact CN VII: upper and lower face symmetric CN VIII: hearing intact CN IX, X: gag intact, uvula midline CN XI: sternocleidomastoid and trapezius muscles intact CN XII: tongue midline Bulk & Tone: normal, no fasciculations. Motor:  5/5 throughout  Sensation: temperature and vibration sensation intact. Deep Tendon Reflexes:  2+ throughout, toes downgoing.  Finger to nose testing:  Without dysmetria.  Heel to shin:  Without dysmetria.  Gait:  Normal station and stride.  Able to turn and tandem walk. Romberg negative.  IMPRESSION: Possible TIA.   Episodic tension-type headache  PLAN: 1.  Advised to start ASA 81mg  daily for secondary stroke prevention (should probably wait until after cath) 2.  Will check fasting lipid panel.  Will need to start statin.  LDL goal should be less than 100.  Even if she has an LDL below 100, she should be on a low-dose statin.  This can be  managed by her PCP. 3.  Check 2D echo 4.  For headaches, she should try Excedrin or naproxen (longer acting than ibuprofen) 5.  Follow up in 3 months.   Thank you for allowing me to take part in the care of this patient.  Shon MilletAdam Elizabelle Fite, DO  CC:  Chilton Greathouseavisankar Avva, MD  Chilton Siiffany [place], MD

## 2015-09-08 NOTE — Patient Instructions (Signed)
1.  Recommend starting aspirin 81mg  daily.  I would wait until after the cath 2.  Will check fasting lipid panel.  Will need to start a cholesterol lowering medication, but want to see the levels first 3.  Check 2D echocardiogram 4.  Mediterranean diet 5.  Further recommendations pending results.

## 2015-09-09 ENCOUNTER — Encounter (HOSPITAL_COMMUNITY)
Admission: RE | Disposition: A | Discharge status: Home / Self Care | Payer: Self-pay | Source: Ambulatory Visit | Attending: Cardiovascular Disease

## 2015-09-09 ENCOUNTER — Ambulatory Visit (HOSPITAL_COMMUNITY)
Admission: RE | Admit: 2015-09-09 | Discharge: 2015-09-09 | Disposition: A | Discharge status: Home / Self Care | Payer: BC Managed Care – PPO | Source: Ambulatory Visit | Attending: Cardiovascular Disease | Admitting: Cardiovascular Disease

## 2015-09-09 DIAGNOSIS — F329 Major depressive disorder, single episode, unspecified: Secondary | ICD-10-CM | POA: Diagnosis not present

## 2015-09-09 DIAGNOSIS — R0789 Other chest pain: Secondary | ICD-10-CM | POA: Insufficient documentation

## 2015-09-09 DIAGNOSIS — R9430 Abnormal result of cardiovascular function study, unspecified: Secondary | ICD-10-CM | POA: Diagnosis not present

## 2015-09-09 DIAGNOSIS — E785 Hyperlipidemia, unspecified: Secondary | ICD-10-CM | POA: Diagnosis not present

## 2015-09-09 DIAGNOSIS — F419 Anxiety disorder, unspecified: Secondary | ICD-10-CM | POA: Diagnosis not present

## 2015-09-09 DIAGNOSIS — R9439 Abnormal result of other cardiovascular function study: Secondary | ICD-10-CM | POA: Insufficient documentation

## 2015-09-09 DIAGNOSIS — R079 Chest pain, unspecified: Secondary | ICD-10-CM | POA: Insufficient documentation

## 2015-09-09 DIAGNOSIS — E669 Obesity, unspecified: Secondary | ICD-10-CM | POA: Insufficient documentation

## 2015-09-09 DIAGNOSIS — Z6832 Body mass index (BMI) 32.0-32.9, adult: Secondary | ICD-10-CM | POA: Diagnosis not present

## 2015-09-09 DIAGNOSIS — Z01818 Encounter for other preprocedural examination: Secondary | ICD-10-CM

## 2015-09-09 HISTORY — PX: CARDIAC CATHETERIZATION: SHX172

## 2015-09-09 LAB — PREGNANCY, URINE: PREG TEST UR: NEGATIVE

## 2015-09-09 SURGERY — LEFT HEART CATH AND CORONARY ANGIOGRAPHY

## 2015-09-09 MED ORDER — FENTANYL CITRATE (PF) 100 MCG/2ML IJ SOLN
INTRAMUSCULAR | Status: AC
  Filled 2015-09-09: qty 2

## 2015-09-09 MED ORDER — ACETAMINOPHEN 325 MG PO TABS: 650.0000 mg | ORAL_TABLET | ORAL | Status: DC | PRN

## 2015-09-09 MED ORDER — ONDANSETRON HCL 4 MG/2ML IJ SOLN: 4.0000 mg | Freq: Four times a day (QID) | INTRAMUSCULAR | Status: DC | PRN

## 2015-09-09 MED ORDER — HEPARIN (PORCINE) IN NACL 2-0.9 UNIT/ML-% IJ SOLN
INTRAMUSCULAR | Status: DC | PRN
  Administered 2015-09-09: 10:00:00

## 2015-09-09 MED ORDER — FENTANYL CITRATE (PF) 100 MCG/2ML IJ SOLN
INTRAMUSCULAR | Status: DC | PRN
  Administered 2015-09-09: 50 ug via INTRAVENOUS
  Administered 2015-09-09: 25 ug via INTRAVENOUS

## 2015-09-09 MED ORDER — SODIUM CHLORIDE 0.9 % IJ SOLN: 3.0000 mL | INTRAMUSCULAR | Status: DC | PRN

## 2015-09-09 MED ORDER — NITROGLYCERIN 1 MG/10 ML FOR IR/CATH LAB
INTRA_ARTERIAL | Status: DC | PRN
  Administered 2015-09-09: 11:00:00

## 2015-09-09 MED ORDER — DIAZEPAM 5 MG PO TABS
5.0000 mg | ORAL_TABLET | ORAL | Status: AC
  Administered 2015-09-09: 5 mg via ORAL

## 2015-09-09 MED ORDER — ASPIRIN 81 MG PO CHEW
81.0000 mg | CHEWABLE_TABLET | ORAL | Status: AC
  Administered 2015-09-09: 81 mg via ORAL

## 2015-09-09 MED ORDER — HEPARIN SODIUM (PORCINE) 1000 UNIT/ML IJ SOLN
INTRAMUSCULAR | Status: AC
  Filled 2015-09-09: qty 1

## 2015-09-09 MED ORDER — MIDAZOLAM HCL 2 MG/2ML IJ SOLN
INTRAMUSCULAR | Status: DC | PRN
  Administered 2015-09-09: 1 mg via INTRAVENOUS
  Administered 2015-09-09: 2 mg via INTRAVENOUS

## 2015-09-09 MED ORDER — MIDAZOLAM HCL 2 MG/2ML IJ SOLN
INTRAMUSCULAR | Status: AC
  Filled 2015-09-09: qty 2

## 2015-09-09 MED ORDER — SODIUM CHLORIDE 0.9 % IV SOLN: INTRAVENOUS | Status: DC

## 2015-09-09 MED ORDER — FENTANYL CITRATE (PF) 100 MCG/2ML IJ SOLN
INTRAMUSCULAR | Status: DC | PRN
  Administered 2015-09-09: 25 ug via INTRAVENOUS

## 2015-09-09 MED ORDER — ASPIRIN 81 MG PO CHEW
CHEWABLE_TABLET | ORAL | Status: AC
  Filled 2015-09-09: qty 1

## 2015-09-09 MED ORDER — HEPARIN SODIUM (PORCINE) 1000 UNIT/ML IJ SOLN
INTRAMUSCULAR | Status: DC | PRN
  Administered 2015-09-09: 5000 [IU] via INTRAVENOUS

## 2015-09-09 MED ORDER — LIDOCAINE HCL (PF) 1 % IJ SOLN
INTRAMUSCULAR | Status: DC | PRN
  Administered 2015-09-09: 5 mL

## 2015-09-09 MED ORDER — SODIUM CHLORIDE 0.9 % IV SOLN: 250.0000 mL | INTRAVENOUS | Status: DC | PRN

## 2015-09-09 MED ORDER — SODIUM CHLORIDE 0.9 % WEIGHT BASED INFUSION: 3.0000 mL/kg/h | INTRAVENOUS | Status: DC

## 2015-09-09 MED ORDER — SODIUM CHLORIDE 0.9 % IJ SOLN: 3.0000 mL | Freq: Two times a day (BID) | INTRAMUSCULAR | Status: DC

## 2015-09-09 MED ORDER — DIAZEPAM 5 MG PO TABS
ORAL_TABLET | ORAL | Status: AC
  Filled 2015-09-09: qty 1

## 2015-09-09 MED ORDER — IOHEXOL 350 MG/ML SOLN
INTRAVENOUS | Status: DC | PRN
Start: 2015-09-09 — End: 2015-09-09
  Administered 2015-09-09: 80 mL via INTRA_ARTERIAL

## 2015-09-09 MED ORDER — DIAZEPAM 5 MG PO TABS: 5.0000 mg | ORAL_TABLET | ORAL | Status: DC | PRN

## 2015-09-09 MED ORDER — HEPARIN (PORCINE) IN NACL 2-0.9 UNIT/ML-% IJ SOLN
INTRAMUSCULAR | Status: AC
  Filled 2015-09-09: qty 1000

## 2015-09-09 MED ORDER — NITROGLYCERIN 1 MG/10 ML FOR IR/CATH LAB: INTRA_ARTERIAL | Status: DC | PRN

## 2015-09-09 MED ORDER — VERAPAMIL HCL 2.5 MG/ML IV SOLN
INTRA_ARTERIAL | Status: DC | PRN
  Administered 2015-09-09: 10:00:00 via INTRA_ARTERIAL

## 2015-09-09 MED ORDER — VERAPAMIL HCL 2.5 MG/ML IV SOLN
INTRAVENOUS | Status: AC
  Filled 2015-09-09: qty 2

## 2015-09-09 MED ORDER — LIDOCAINE HCL (PF) 1 % IJ SOLN
INTRAMUSCULAR | Status: AC
  Filled 2015-09-09: qty 30

## 2015-09-09 SURGICAL SUPPLY — 11 items
CATH INFINITI 5FR ANG PIGTAIL (CATHETERS) ×3 IMPLANT
CATH OPTITORQUE TIG 4.0 5F (CATHETERS) ×3 IMPLANT
DEVICE RAD COMP TR BAND LRG (VASCULAR PRODUCTS) ×3 IMPLANT
GLIDESHEATH SLEND A-KIT 6F 22G (SHEATH) ×3 IMPLANT
KIT HEART LEFT (KITS) ×3 IMPLANT
PACK CARDIAC CATHETERIZATION (CUSTOM PROCEDURE TRAY) ×3 IMPLANT
SYR MEDRAD MARK V 150ML (SYRINGE) ×3 IMPLANT
TRANSDUCER W/STOPCOCK (MISCELLANEOUS) ×3 IMPLANT
TUBING CIL FLEX 10 FLL-RA (TUBING) ×3 IMPLANT
WIRE HI TORQ VERSACORE-J 145CM (WIRE) ×2 IMPLANT
WIRE SAFE-T 1.5MM-J .035X260CM (WIRE) ×3 IMPLANT

## 2015-09-09 NOTE — Discharge Instructions (Signed)
Radial Site Care °Refer to this sheet in the next few weeks. These instructions provide you with information about caring for yourself after your procedure. Your health care provider may also give you more specific instructions. Your treatment has been planned according to current medical practices, but problems sometimes occur. Call your health care provider if you have any problems or questions after your procedure. °WHAT TO EXPECT AFTER THE PROCEDURE °After your procedure, it is typical to have the following: °· Bruising at the radial site that usually fades within 1-2 weeks. °· Blood collecting in the tissue (hematoma) that may be painful to the touch. It should usually decrease in size and tenderness within 1-2 weeks. °HOME CARE INSTRUCTIONS °· Take medicines only as directed by your health care provider. °· You may shower 24-48 hours after the procedure or as directed by your health care provider. Remove the bandage (dressing) and gently wash the site with plain soap and water. Pat the area dry with a clean towel. Do not rub the site, because this may cause bleeding. °· Do not take baths, swim, or use a hot tub until your health care provider approves. °· Check your insertion site every day for redness, swelling, or drainage. °· Do not apply powder or lotion to the site. °· Do not flex or bend the affected arm for 24 hours or as directed by your health care provider. °· Do not push or pull heavy objects with the affected arm for 24 hours or as directed by your health care provider. °· Do not lift over 10 lb (4.5 kg) for 5 days after your procedure or as directed by your health care provider. °· Ask your health care provider when it is okay to: °¨ Return to work or school. °¨ Resume usual physical activities or sports. °¨ Resume sexual activity. °· Do not drive home if you are discharged the same day as the procedure. Have someone else drive you. °· You may drive 24 hours after the procedure unless otherwise  instructed by your health care provider. °· Do not operate machinery or power tools for 24 hours after the procedure. °· If your procedure was done as an outpatient procedure, which means that you went home the same day as your procedure, a responsible adult should be with you for the first 24 hours after you arrive home. °· Keep all follow-up visits as directed by your health care provider. This is important. °SEEK MEDICAL CARE IF: °· You have a fever. °· You have chills. °· You have increased bleeding from the radial site. Hold pressure on the site. °SEEK IMMEDIATE MEDICAL CARE IF: °· You have unusual pain at the radial site. °· You have redness, warmth, or swelling at the radial site. °· You have drainage (other than a small amount of blood on the dressing) from the radial site. °· The radial site is bleeding, and the bleeding does not stop after 30 minutes of holding steady pressure on the site. °· Your arm or hand becomes pale, cool, tingly, or numb. °  °This information is not intended to replace advice given to you by your health care provider. Make sure you discuss any questions you have with your health care provider. °  °Document Released: 09/24/2010 Document Revised: 09/12/2014 Document Reviewed: 03/10/2014 °Elsevier Interactive Patient Education ©2016 [employer] Inc[employer] ° °

## 2015-09-09 NOTE — Interval H&P Note (Signed)
Cath Lab Visit (complete for each Cath Lab visit)  Clinical Evaluation Leading to the Procedure:   ACS: No.  Non-ACS:    Anginal Classification: CCS II  Anti-ischemic medical therapy: Minimal Therapy (1 class of medications)  Non-Invasive Test Results: Intermediate-risk stress test findings: cardiac mortality 1-3%/year  Prior CABG: No previous CABG      History and Physical Interval Note:  09/09/2015 10:05 AM  Christine Liu  has presented today for surgery, with the diagnosis of abnormal nuc  The various methods of treatment have been discussed with the patient and family. After consideration of risks, benefits and other options for treatment, the patient has consented to  Procedure(s): Left Heart Cath and Coronary Angiography (N/A) as a surgical intervention .  The patient's history has been reviewed, patient examined, no change in status, stable for surgery.  I have reviewed the patient's chart and labs.  Questions were answered to the patient's satisfaction.     KELLY,THOMAS A

## 2015-09-09 NOTE — H&P (Signed)
  Updated H&P:  Please refer to Dr. Elmarie Shileyiffany 's initial history and physical evaluation from 08/03/2015.  Patient is a 50 year old female who has a history of hyperlipidemia and had developed somewhat atypical chest pain.  She also had transient speech difficulty which had occurred in October.  Subsequent to her evaluation with Dr. Duke Salviaandolph, the patient underwent neurologic evaluation who felt she was stable and did not experience any prior stroke or seizure activity.  The patient was referred for a treadmill test on 09/01/15 which was abnormal with stress-induced ST segment depression.  The patient was contacted by Dr. Duke Salviaandolph and now presents for definitive cardiac catheterization. The risks and benefits of a cardiac catheterization including, but not limited to, death, stroke, MI, kidney damage and bleeding were discussed with the patient who indicates understanding and agrees to proceed.   Lennette BihariKELLY,THOMAS A, MD  09/09/2015  10:05 AM

## 2015-09-09 NOTE — Research (Signed)
CADLAD Informed Consent   Subject Name: Christine Liu  Subject met inclusion and exclusion criteria.  The informed consent form, study requirements and expectations were reviewed with the subject and questions and concerns were addressed prior to the signing of the consent form.  The subject verbalized understanding of the trail requirements.  The subject agreed to participate in the CADLAD trial and signed the informed consent.  The informed consent was obtained prior to performance of any protocol-specific procedures for the subject.  A copy of the signed informed consent was given to the subject and a copy was placed in the subject's medical record.  Hedrick,Valarie Farace W 09/09/2015, [id number]

## 2015-09-10 ENCOUNTER — Encounter (HOSPITAL_COMMUNITY): Payer: Self-pay | Admitting: Cardiovascular Disease

## 2015-09-23 ENCOUNTER — Ambulatory Visit (INDEPENDENT_AMBULATORY_CARE_PROVIDER_SITE_OTHER): Payer: BC Managed Care – PPO | Admitting: Neurology

## 2015-09-23 ENCOUNTER — Encounter: Payer: Self-pay | Admitting: Neurology

## 2015-09-23 VITALS — BP 130/88 | HR 86 | Resp 20 | Ht 67.0 in | Wt 206.0 lb

## 2015-09-23 DIAGNOSIS — N951 Menopausal and female climacteric states: Secondary | ICD-10-CM | POA: Diagnosis not present

## 2015-09-23 DIAGNOSIS — R0689 Other abnormalities of breathing: Secondary | ICD-10-CM | POA: Diagnosis not present

## 2015-09-23 DIAGNOSIS — R002 Palpitations: Secondary | ICD-10-CM

## 2015-09-23 DIAGNOSIS — G4719 Other hypersomnia: Secondary | ICD-10-CM | POA: Diagnosis not present

## 2015-09-23 DIAGNOSIS — R0683 Snoring: Secondary | ICD-10-CM | POA: Diagnosis not present

## 2015-09-23 NOTE — Patient Instructions (Signed)
Insomnia Insomnia is a sleep disorder that makes it difficult to fall asleep or to stay asleep. Insomnia can cause tiredness (fatigue), low energy, difficulty concentrating, mood swings, and poor performance at work or school.  There are three different ways to classify insomnia:  Difficulty falling asleep.  Difficulty staying asleep.  Waking up too early in the morning. Any type of insomnia can be long-term (chronic) or short-term (acute). Both are common. Short-term insomnia usually lasts for three months or less. Chronic insomnia occurs at least three times a week for longer than three months. CAUSES  Insomnia may be caused by another condition, situation, or substance, such as:  Anxiety.  Certain medicines.  Gastroesophageal reflux disease (GERD) or other gastrointestinal conditions.  Asthma or other breathing conditions.  Restless legs syndrome, sleep apnea, or other sleep disorders.  Chronic pain.  Menopause. This may include hot flashes.  Stroke.  Abuse of alcohol, tobacco, or illegal drugs.  Depression.  Caffeine.   Neurological disorders, such as Alzheimer disease.  An overactive thyroid (hyperthyroidism). The cause of insomnia may not be known. RISK FACTORS Risk factors for insomnia include:  Gender. Women are more commonly affected than men.  Age. Insomnia is more common as you get older.  Stress. This may involve your professional or personal life.  Income. Insomnia is more common in people with lower income.  Lack of exercise.   Irregular work schedule or night shifts.  Traveling between different time zones. SIGNS AND SYMPTOMS If you have insomnia, trouble falling asleep or trouble staying asleep is the main symptom. This may lead to other symptoms, such as:  Feeling fatigued.  Feeling nervous about going to sleep.  Not feeling rested in the morning.  Having trouble concentrating.  Feeling irritable, anxious, or depressed. TREATMENT   Treatment for insomnia depends on the cause. If your insomnia is caused by an underlying condition, treatment will focus on addressing the condition. Treatment may also include:   Medicines to help you sleep.  Counseling or therapy.  Lifestyle adjustments. HOME CARE INSTRUCTIONS   Take medicines only as directed by your health care provider.  Keep regular sleeping and waking hours. Avoid naps.  Keep a sleep diary to help you and your health care provider figure out what could be causing your insomnia. Include:   When you sleep.  When you wake up during the night.  How well you sleep.   How rested you feel the next day.  Any side effects of medicines you are taking.  What you eat and drink.   Make your bedroom a comfortable place where it is easy to fall asleep:  Put up shades or special blackout curtains to block light from outside.  Use a white noise machine to block noise.  Keep the temperature cool.   Exercise regularly as directed by your health care provider. Avoid exercising right before bedtime.  Use relaxation techniques to manage stress. Ask your health care provider to suggest some techniques that may work well for you. These may include:  Breathing exercises.  Routines to release muscle tension.  Visualizing peaceful scenes.  Cut back on alcohol, caffeinated beverages, and cigarettes, especially close to bedtime. These can disrupt your sleep.  Do not overeat or eat spicy foods right before bedtime. This can lead to digestive discomfort that can make it hard for you to sleep.  Limit screen use before bedtime. This includes:  Watching TV.  Using your smartphone, tablet, and computer.  Stick to a routine. This   can help you fall asleep faster. Try to do a quiet activity, brush your teeth, and go to bed at the same time each night.  Get out of bed if you are still awake after 15 minutes of trying to sleep. Keep the lights down, but try reading or  doing a quiet activity. When you feel sleepy, go back to bed.  Make sure that you drive carefully. Avoid driving if you feel very sleepy.  Keep all follow-up appointments as directed by your health care provider. This is important. SEEK MEDICAL CARE IF:   You are tired throughout the day or have trouble in your daily routine due to sleepiness.  You continue to have sleep problems or your sleep problems get worse. SEEK IMMEDIATE MEDICAL CARE IF:   You have serious thoughts about hurting yourself or someone else.   This information is not intended to replace advice given to you by your health care provider. Make sure you discuss any questions you have with your health care provider.   Document Released: 08/19/2000 Document Revised: 05/13/2015 Document Reviewed: 05/23/2014 Elsevier Interactive Patient Education 2016 [employer] Inc.  

## 2015-09-23 NOTE — Progress Notes (Signed)
SLEEP MEDICINE CLINIC   Provider:  Melvyn Novas, M D  Referring Provider: Chilton Greathouse, MD Primary Care Physician:  Hoyle Sauer, MD  Chief Complaint  Patient presents with  . New Patient (Initial Visit)    has trouble going to sleep, non restorative sleep, stopped drinking over a year ago and has had trouble ever since, rm 10, alone    HPI:  Christine Liu is a 50 y.o. female , seen here as a referral from Dr. Felipa Eth for a sleep evaluation,  Chief complaint according to patient : " i can't sleep since i stopped drinking" . " I had a TIA  In October 2016 in [place], [place]" .  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 to further work her up for a TIA or the possibility thereof. The workup has returned negative, she even underwent a heart catheterization which was negative. Dr. Daphene Jaeger performed the cath. She sometimes feels palpitations and she still not having a completed workup. Her chief complaint today was addressed first with her primary care physician she reports feeling hot and cold having flushing feelings, and excessive daytime sleepiness. She feels that she never gets enough sleep and that she has decreased energy during the day there is also some depression present. She is concerned about headaches memory loss insomnia daytime sleepiness and restless legs. Her Epworth sleepiness score was endorsed at 14 points. The fatigue severity score at 51 points. She reports that she feels she doesn't feel like doing anything cord" losing interest and not having the old levels energy.   Sleep habits are as follows: The patient sleeps alone in her bedroom, her husband sleeps in a separate room he is a loud snorer and has not used his prescribed CPAP at 10 years. She made a point that it is in brand-new condition. She  has chosen to sleep alone first of all because she feels interrupted by her husband's TV watching habit in bedroom, and by his snoring as well. She also likes "all. Her own bedroom is described as cool, quiet and dark. She usually goes to bed around 10:30 PM but she doesn't fall asleep until midnight. She plays on a smart phone which is keeping her awake, too. After she finally goes to sleep around midnight she may wake up every 90 minutes or so spontaneously she cannot identify a reason why. She does not have to go to the bathroom. There is no pain no discomfort no vivid dreams waking her. No palpitations. She feels restless, tosses and turns. She has an alarm set at 5:30 and she has a very difficult time rising and usually doesn't get up until 6:15. She has to be in school by 7.20. AM. She commutes for about 30 minutes. She is spending her day in a classroom but she has daylight access. She takes Vitamin D , she  Skips breakfast, has one cup of coffee.    Sleep medical history and family sleep history:  Her parents were snoring. Her father especially was a snorer, but not identified as an apnea patient, he had a stroke at 52. . The patient was not a sleep walker , but  did  have night terrors in childhood.   Social history: [facility] ; Dr Everlena Cooper, 09-08-15 Christine Liu is a 50 year old right-handed  female with hyperlipidemia, chronic back pain and depression and history of prior alcohol abuse who presents for evaluation of TIA.  History obtained by patient and cardiology note.  Labs and imaging of brain MRI and head and neck MRA reviewed.  In October, she was standing with her family when suddenly she was unable to speak.  When other people were talking to her, it sounded like gibberish.  There was no headache, dizziness, focal weakness, facial droop or vision loss.  It lasted one to two minutes.  Since then, she reports headache.  It is either on top or front of head.  It is non-throbbing.  There is some  photophobia but no nausea, phonophobia or visual disturbance.  It lasts several hours and occurs once a month.  Tylenol and Advil are ineffective.    MRI of brain from 09/03/15 showed moderately advanced chronic small vessel ischemic changes but no acute or subacute infarcts.  MRA of the head and neck were unremarkable. She also has been having chest pain since April.  Stress test showed nonspecific ST changes.  She is scheduled for a cath tomorrow.  Her father had a stroke at age 80. Recent CBC, BMP, PT and INR were normal.   Review of Systems: Out of a complete 14 system review, the patient complains of only the following symptoms, and all other reviewed systems are negative.   Epworth score 14,  , Fatigue severity score 51  , depression score 2    Social History   Social History  . Marital Status: Married    Spouse Name: N/A  . Number of Children: 1  . Years of Education: N/A   Occupational History  . 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 Smoking status: Former Smoker -- 0.12 packs/day for 4 years    Types: Cigarettes    Quit date: 10/03/1986  . Smokeless tobacco: Never Used  . Alcohol Use: 3.6 oz/week    6 Cans of beer per week     Comment: 05/30/2012 "drink only on weekends"  . Drug Use: No  . Sexual Activity: Not Currently   Other Topics Concern  . Not on file   Social History Narrative   Epworth sleepiness scale 15 as of 08/03/15. Pt has college degree.    Drinks 2 cups of caffeine daily.    Family History  Problem Relation Age of Onset  . Arthritis Mother     RA  . Osteoporosis Mother   . Hyperlipidemia Mother   . Colon cancer Neg Hx   . Stomach cancer Neg Hx   . Hypertension Father   . Hyperlipidemia Father     Past Medical History  Diagnosis Date  . Hyperlipidemia     doesn't require meds at present  . Chronic back pain     fractured vertebrae in 2 places   . Depression     takes Wellbutrin daily  . Anxiety     takes  Cymbalta daily  . Insomnia     related to back pain  . Arthritis     "spine, legs" (05/30/2012)  . Osteoporosis   . Obesity (BMI 30.0-34.9) 08/03/2015  . GERD (gastroesophageal reflux disease)     Past Surgical History  Procedure Laterality Date  . Mandible surgery  ~ 1981  . Colonoscopy    . Esophagogastroduodenoscopy    . Posterior fusion lumbar spine  05/17/2012    L4-5; "put cage in"  . Incision  and drainage posterior lumbar spine  05/30/2012    S/P fusion 2 wk ago  . Lumbar wound debridement  05/30/2012    Procedure: LUMBAR WOUND DEBRIDEMENT;  Surgeon: Tia Alert, MD;  Location: [facility];  Service: Neurosurgery;  Laterality: Bilateral;  Lumbar wound debridement  . Cardiac catheterization N/A 09/09/2015    Procedure: Left Heart Cath and Coronary Angiography;  Surgeon: Lennette Bihari, MD;  Location: [facility] INVASIVE CV LAB;  Service: Cardiovascular;  Laterality: N/A;    Current Outpatient Prescriptions  Medication Sig Dispense Refill  . buPROPion (WELLBUTRIN SR) 150 MG 12 hr tablet Take 150 mg by mouth daily.    . celecoxib (CELEBREX) 200 MG capsule Take 200 mg by mouth daily.    . cholecalciferol (VITAMIN D) 1000 UNITS tablet Take 1,000 Units by mouth daily.    Marland Kitchen escitalopram (LEXAPRO) 5 MG tablet Take 5 mg by mouth daily.    . fluticasone (FLONASE) 50 MCG/ACT nasal spray Place into both nostrils daily.    . LO LOESTRIN FE 1 MG-10 MCG / 10 MCG tablet Take 1 tablet by mouth daily.   2  . metoprolol succinate (TOPROL-XL) 50 MG 24 hr tablet Take 50 mg by mouth daily.     No current facility-administered medications for this visit.    Allergies as of 09/23/2015  . (No Known Allergies)    Vitals: BP 130/88 mmHg  Pulse 86  Resp 20  Ht  (1.702 m)  Wt 206 lb (93.441 kg)  BMI 32.26 kg/m2 Last Weight:  Wt Readings from Last 1 Encounters:  09/23/15 206 lb (93.441 kg)   NFA:OZHY mass index is 32.26 kg/(m^2).     Last Height:   Ht Readings from Last 1 Encounters:    09/23/15  (1.702 m)    Physical exam:  General: The patient is awake, alert and appears not in acute distress. The patient is well groomed. Head: Normocephalic, atraumatic. Neck is supple. Mallampati 2 ,  neck circumference:16. Nasal airflow restricted , deviated septum on the left.  TMJ is not evident . Retrognathia is not seen, but she has a crowded lower jaw. Bruxism..  Cardiovascular:  Regular rate and rhythm , without  murmurs or carotid bruit, and without distended neck veins. Respiratory: Lungs are clear to auscultation. Skin:  Without evidence of edema, or rash Trunk: BMI is elevated . The patient's posture is erect .  Neurologic exam : The patient is awake and alert, oriented to place and time.   Memory subjective  described as impaired . Attention span & concentration ability appears normal.  Speech is fluent,  without dysarthria, dysphonia or aphasia.  Mood and affect are appropriate.  Cranial nerves: Pupils are equal and briskly reactive to light. Funduscopic exam without evidence of pallor or edema.  Extraocular movements  in vertical and horizontal planes intact and without nystagmus. Visual fields by finger perimetry are intact. Hearing to finger rub intact.   Facial sensation intact to fine touch.  Facial motor strength is symmetric and tongue and uvula move midline. Shoulder shrug was symmetrical.   Motor exam:  Normal tone, muscle bulk and symmetric strength in all extremities. Sensory:  Fine touch, pinprick and vibration were tested in all extremities. Proprioception tested in the upper extremities was normal. Coordination: Rapid alternating movements in the fingers/hands was normal. Finger-to-nose maneuver  normal without evidence of ataxia, dysmetria or tremor.  Gait and station: Patient walks without assistive device and is able unassisted to climb up  to the exam table. Strength within normal limits.  Stance is stable and normal.  Toe stand intact  .Tandem  gait is unfragmented. Turns with 3 Steps.   Deep tendon reflexes: in the  upper and lower extremities are symmetric and intact. Babinski maneuver response is  downgoing.  The patient was advised of the nature of the diagnosed sleep disorder , the treatment options and risks for general a health and wellness arising from not treating the condition.  I spent more than 40 minutes of face to face time with the patient. Greater than 50% of time was spent in counseling and coordination of care. We have discussed the diagnosis and differential and I answered the patient's questions.     Assessment:  After physical and neurologic examination, review of laboratory studies,  Personal review of imaging studies, reports of other /same  Imaging studies ,  Results of polysomnography/ neurophysiology testing and pre-existing records as far as provided in visit., my assessment is   1) Mrs. Vink complains of primary insomnia evident since she has changed her habits and her lifestyle. Since she does not drink alcohol anymore her insomnia has been more evident and interestingly this led to weight gain 2. There is a loss of interest a loss of energy that she has reported and excessive daytime sleepiness with an Epworth score of 14 the highest scores of 3 points in sitting and reading sleeping when a passenger in a car, lying down in the afternoon, sitting quietly after lunch.  2) depression treatment ;The patient is on bupropion for depression but was also placed on metoprolol for palpitations and heart rate. This all came about after her TIA evaluation. Theoretically metoprolol can promote depression to some degree it should not promote weight gain however. Wellbutrin is known to be the weight neutral.  3) the patient has a family history of strokes her father had a stroke at age 15 of 50, her grandfather died on her birthday, was not yet 10.  Her mother died last year in 05/28/2023 of pancreatic cancer. Given the  possible TIA history and nocturnal palpitations, I would like for the patient to undergo an attended sleep study. I will screen her especially for sleep apnea, hypoxia, and she reports tension headaches in daytime but no sleep related headaches. She has never experienced a headache waking her up or waking with a headache.    Plan:  Treatment plan and additional workup : SPLIT polysomnography. RV after SPLIT.    Porfirio Mylar Haruko Mersch MD  09/23/2015   CC: Chilton Greathouse, Md [address] [place], [place] [address]

## 2015-10-07 ENCOUNTER — Ambulatory Visit (HOSPITAL_COMMUNITY): Payer: BC Managed Care – PPO | Attending: Cardiovascular Disease

## 2015-10-07 ENCOUNTER — Other Ambulatory Visit: Payer: Self-pay

## 2015-10-07 DIAGNOSIS — Z87891 Personal history of nicotine dependence: Secondary | ICD-10-CM | POA: Insufficient documentation

## 2015-10-07 DIAGNOSIS — R002 Palpitations: Secondary | ICD-10-CM | POA: Insufficient documentation

## 2015-10-07 DIAGNOSIS — G459 Transient cerebral ischemic attack, unspecified: Secondary | ICD-10-CM | POA: Insufficient documentation

## 2015-10-07 DIAGNOSIS — E785 Hyperlipidemia, unspecified: Secondary | ICD-10-CM | POA: Insufficient documentation

## 2015-10-08 ENCOUNTER — Telehealth: Payer: Self-pay

## 2015-10-08 NOTE — Telephone Encounter (Signed)
-----   Message from Drema Dallas, DO sent at 10/08/2015  7:20 AM EST ----- Echo is normal.

## 2015-10-08 NOTE — Telephone Encounter (Signed)
Detailed message left for patient with results

## 2015-10-09 ENCOUNTER — Ambulatory Visit (INDEPENDENT_AMBULATORY_CARE_PROVIDER_SITE_OTHER): Payer: BC Managed Care – PPO | Admitting: Neurology

## 2015-10-09 DIAGNOSIS — R0689 Other abnormalities of breathing: Secondary | ICD-10-CM

## 2015-10-09 DIAGNOSIS — G4710 Hypersomnia, unspecified: Secondary | ICD-10-CM

## 2015-10-09 DIAGNOSIS — G4719 Other hypersomnia: Secondary | ICD-10-CM

## 2015-10-09 DIAGNOSIS — R0683 Snoring: Secondary | ICD-10-CM

## 2015-10-09 DIAGNOSIS — R002 Palpitations: Secondary | ICD-10-CM

## 2015-10-09 DIAGNOSIS — N951 Menopausal and female climacteric states: Secondary | ICD-10-CM

## 2015-10-10 NOTE — Sleep Study (Signed)
Please see the scanned sleep study interpretation located in the procedure tab within the chart review section.   

## 2015-10-19 ENCOUNTER — Telehealth: Payer: Self-pay

## 2015-10-19 NOTE — Telephone Encounter (Signed)
Called pt to discuss sleep study results. No answer, left a message asking her to call us back.

## 2015-10-20 NOTE — Telephone Encounter (Signed)
Pt returned call , please call [phone] her work #. Please ask for the pt and the secretary with page her. Thank you

## 2015-10-20 NOTE — Telephone Encounter (Signed)
Spoke to pt. I advised her that her sleep study does not reveal significant sleep apnea or significant periodic limb movements of sleep results in significant sleep disruption. I advised her that her PCP could address pain control if she awakens from sleep and a referral to a sleep psychologist if insomnia is a concern. I offered a follow up with Dr. Vickey Huger but pt declined. She asked me to fax a copy of her sleep study to Dr. Chilton Greathouse. Pt verbalized understanding.

## 2017-06-09 ENCOUNTER — Ambulatory Visit
Admission: RE | Admit: 2017-06-09 | Discharge: 2017-06-09 | Disposition: A | Discharge status: Home / Self Care | Payer: BC Managed Care – PPO | Source: Ambulatory Visit | Attending: Internal Medicine | Admitting: Internal Medicine

## 2017-06-09 ENCOUNTER — Other Ambulatory Visit: Payer: Self-pay | Admitting: Internal Medicine

## 2017-06-09 DIAGNOSIS — R42 Dizziness and giddiness: Secondary | ICD-10-CM

## 2017-06-09 DIAGNOSIS — Z8673 Personal history of transient ischemic attack (TIA), and cerebral infarction without residual deficits: Secondary | ICD-10-CM

## 2017-06-13 ENCOUNTER — Other Ambulatory Visit: Payer: Self-pay | Admitting: Internal Medicine

## 2017-06-13 DIAGNOSIS — R42 Dizziness and giddiness: Secondary | ICD-10-CM

## 2017-07-07 ENCOUNTER — Ambulatory Visit (INDEPENDENT_AMBULATORY_CARE_PROVIDER_SITE_OTHER): Payer: BC Managed Care – PPO | Admitting: Neurology

## 2017-07-07 ENCOUNTER — Encounter: Payer: Self-pay | Admitting: Neurology

## 2017-07-07 VITALS — Ht 67.0 in | Wt 210.0 lb

## 2017-07-07 DIAGNOSIS — R42 Dizziness and giddiness: Secondary | ICD-10-CM

## 2017-07-07 NOTE — Patient Instructions (Signed)
The dizziness just may have been typical vertigo (inner ear issue), however you should be taking aspirin and cholesterol medication given the event from 2 years ago  1.  Aspirin 81mg  daily and statin therapy (Crestor) 2.  Exercise 3.  Mediterranean diet (see below)     Why follow it? Research shows. . Those who follow the Mediterranean diet have a reduced risk of heart disease  . The diet is associated with a reduced incidence of Parkinson's and Alzheimer's diseases . People following the diet may have longer life expectancies and lower rates of chronic diseases  . The Dietary Guidelines for Americans recommends the Mediterranean diet as an eating plan to promote health and prevent disease  What Is the Mediterranean Diet?  . Healthy eating plan based on typical foods and recipes of Mediterranean-style cooking . The diet is primarily a plant based diet; these foods should make up a majority of meals   Starches - Plant based foods should make up a majority of meals - They are an important sources of vitamins, minerals, energy, antioxidants, and fiber - Choose whole grains, foods high in fiber and minimally processed items  - Typical grain sources include wheat, oats, barley, corn, brown rice, bulgar, farro, millet, polenta, couscous  - Various types of beans include chickpeas, lentils, fava beans, black beans, white beans   Fruits  Veggies - Large quantities of antioxidant rich fruits & veggies; 6 or more servings  - Vegetables can be eaten raw or lightly drizzled with oil and cooked  - Vegetables common to the traditional Mediterranean Diet include: artichokes, arugula, beets, broccoli, brussel sprouts, cabbage, carrots, celery, collard greens, cucumbers, eggplant, kale, leeks, lemons, lettuce, mushrooms, okra, onions, peas, peppers, potatoes, pumpkin, radishes, rutabaga, shallots, spinach, sweet potatoes, turnips, zucchini - Fruits common to the Mediterranean Diet include: apples, apricots,  avocados, cherries, clementines, dates, figs, grapefruits, grapes, melons, nectarines, oranges, peaches, pears, pomegranates, strawberries, tangerines  Fats - Replace butter and margarine with healthy oils, such as olive oil, canola oil, and tahini  - Limit nuts to no more than a handful a day  - Nuts include walnuts, almonds, pecans, pistachios, pine nuts  - Limit or avoid candied, honey roasted or heavily salted nuts - Olives are central to the [employer] - can be eaten whole or used in a variety of dishes   Meats Protein - Limiting red meat: no more than a few times a month - When eating red meat: choose lean cuts and keep the portion to the size of deck of cards - Eggs: approx. 0 to 4 times a week  - Fish and lean poultry: at least 2 a week  - Healthy protein sources include, chicken, [place], lean beef, lamb - Increase intake of seafood such as tuna, salmon, trout, mackerel, shrimp, scallops - Avoid or limit high fat processed meats such as sausage and bacon  Dairy - Include moderate amounts of low fat dairy products  - Focus on healthy dairy such as fat free yogurt, skim milk, low or reduced fat cheese - Limit dairy products higher in fat such as whole or 2% milk, cheese, ice cream  Alcohol - Moderate amounts of red wine is ok  - No more than 5 oz daily for women (all ages) and men older than age 65  - No more than 10 oz of wine daily for men younger than 45  Other - Limit sweets and other desserts  - Use herbs and spices instead of salt to  flavor foods  - Herbs and spices common to the traditional Mediterranean Diet include: basil, bay leaves, chives, cloves, cumin, fennel, garlic, lavender, marjoram, mint, oregano, parsley, pepper, rosemary, sage, savory, sumac, tarragon, thyme   It's not just a diet, it's a lifestyle:  . The Mediterranean diet includes lifestyle factors typical of those in the region  . Foods, drinks and meals are best eaten with others and savored . Daily  physical activity is important for overall good health . This could be strenuous exercise like running and aerobics . This could also be more leisurely activities such as walking, housework, yard-work, or taking the stairs . Moderation is the key; a balanced and healthy diet accommodates most foods and drinks . Consider portion sizes and frequency of consumption of certain foods   Meal Ideas & Options:  . Breakfast:  o Whole wheat toast or whole wheat English muffins with peanut butter & hard boiled egg o Steel cut oats topped with apples & cinnamon and skim milk  o Fresh fruit: banana, strawberries, melon, berries, peaches  o Smoothies: strawberries, bananas, greek yogurt, peanut butter o Low fat greek yogurt with blueberries and granola  o Egg white omelet with spinach and mushrooms o Breakfast couscous: whole wheat couscous, apricots, skim milk, cranberries  . Sandwiches:  o Hummus and grilled vegetables (peppers, zucchini, squash) on whole wheat bread   o Grilled chicken on whole wheat pita with lettuce, tomatoes, cucumbers or tzatziki  o Tuna salad on whole wheat bread: tuna salad made with greek yogurt, olives, red peppers, capers, green onions o Garlic rosemary lamb pita: lamb sauted with garlic, rosemary, salt & pepper; add lettuce, cucumber, greek yogurt to pita - flavor with lemon juice and black pepper  . Seafood:  o Mediterranean grilled salmon, seasoned with garlic, basil, parsley, lemon juice and black pepper o Shrimp, lemon, and spinach whole-grain pasta salad made with low fat greek yogurt  o Seared scallops with lemon orzo  o Seared tuna steaks seasoned salt, pepper, coriander topped with tomato mixture of olives, tomatoes, olive oil, minced garlic, parsley, green onions and cappers  . Meats:  o Herbed greek chicken salad with kalamata olives, cucumber, feta  o Red bell peppers stuffed with spinach, bulgur, lean ground beef (or lentils) & topped with feta   o Kebabs:  skewers of chicken, tomatoes, onions, zucchini, squash  o [place] burgers: made with red onions, mint, dill, lemon juice, feta cheese topped with roasted red peppers . Vegetarian o Cucumber salad: cucumbers, artichoke hearts, celery, red onion, feta cheese, tossed in olive oil & lemon juice  o Hummus and whole grain pita points with a greek salad (lettuce, tomato, feta, olives, cucumbers, red onion) o Lentil soup with celery, carrots made with vegetable broth, garlic, salt and pepper  o Tabouli salad: parsley, bulgur, mint, scallions, cucumbers, tomato, radishes, lemon juice, olive oil, salt and pepper.

## 2017-07-07 NOTE — Progress Notes (Signed)
NEUROLOGY FOLLOW UP OFFICE NOTE  Christine Liu [id number]  HISTORY OF PRESENT ILLNESS: Christine Liu is a 51 year old right-handed female with hyperlipidemia, chronic back pain and depression and history of prior alcohol abuse whom I previously saw for TIA presents today for dizziness.  History supplemented by PCP note.  UPDATE: I last saw her in January 2017 for TIA in which she exhibited transient aphasia.  TTE from 10/07/15 showed normal EF of 55-60% with no cardiac source of emboli.  She was advised to start ASA 81mg  daily for secondary stroke prevention, but did not remember.  I had also ordered a lipid panel, which she never had done.  On 06/09/17, she was walking when suddenly experienced onset of spinning sensation.  She experienced goosebumps up and down her left arm.  There was no associated double vision, nausea, tinnitus, hearing loss, unilateral numbness or weakness.  She sat down and it subsided after 5 minutes.  She got up and started walking when it reoccurred.  She had to sit down and it resolved after 20-30 minutes.  She did report some left eye pain.  The previous night, she experienced an area of heat over the right parietal region, about the size of her palm and lasting a minute.  Otherwise, no headache.  MRI of brain without contrast performed on 06/09/17 was personally reviewed and again revealed mild to moderate chronic small vessel ischemic changes, stable compared to 09/03/15, but no mass lesion or acute abnormality.  She was started on ASA 81mg  daily and Crestor.  She has not had a recurrence.  She has headaches about once a month, pressure/throbbing, top or back of head, lasting a day and some photosensitivity but no nausea.   HISTORY: In October 2016, she was standing with her family when suddenly she was unable to speak.  When other people were talking to her, it sounded like gibberish.  There was no headache, dizziness, focal weakness, facial droop or vision loss.  It  lasted one to two minutes.  Since then, she reports headache.  It is either on top or front of head.  It is non-throbbing.  There is some photophobia but no nausea, phonophobia or visual disturbance.  It lasts several hours and occurs once a month.  Tylenol and Advil are ineffective.     MRI of brain from 09/03/15 showed moderately advanced chronic small vessel ischemic changes but no acute or subacute infarcts.  MRA of the head and neck were unremarkable with no evidence of intracranial or extracranial arterial occlusion or significant stenosis.   She has history of chest pain.  Stress test showed nonspecific ST changes.  Cardiac catheterization was normal.  For excessive daytime sleepiness, she underwent sleep study in February 2017, which did not reveal significant sleep apnea or PLMS.   Her father had a stroke at age 50.  PAST MEDICAL HISTORY: Past Medical History:  Diagnosis Date  . Anxiety    takes Cymbalta daily  . Arthritis    "spine, legs" (05/30/2012)  . Chronic back pain    fractured vertebrae in 2 places   . Depression    takes Wellbutrin daily  . GERD (gastroesophageal reflux disease)   . Hyperlipidemia    doesn't require meds at present  . Insomnia    related to back pain  . Obesity (BMI 30.0-34.9) 08/03/2015  . Osteoporosis     MEDICATIONS: Current Outpatient Prescriptions on File Prior to Visit  Medication Sig Dispense Refill  .  buPROPion (WELLBUTRIN SR) 150 MG 12 hr tablet Take 150 mg by mouth daily.    . celecoxib (CELEBREX) 200 MG capsule Take 200 mg by mouth daily.    . cholecalciferol (VITAMIN D) 1000 UNITS tablet Take 1,000 Units by mouth daily.    Marland Kitchen escitalopram (LEXAPRO) 5 MG tablet Take 5 mg by mouth daily.    . fluticasone (FLONASE) 50 MCG/ACT nasal spray Place into both nostrils daily.    . LO LOESTRIN FE 1 MG-10 MCG / 10 MCG tablet Take 1 tablet by mouth daily.   2  . metoprolol succinate (TOPROL-XL) 50 MG 24 hr tablet Take 50 mg by mouth daily.      No current facility-administered medications on file prior to visit.     ALLERGIES: No Known Allergies  FAMILY HISTORY: Family History  Problem Relation Age of Onset  . Arthritis Mother        RA  . Osteoporosis Mother   . Hyperlipidemia Mother   . Colon cancer Neg Hx   . Stomach cancer Neg Hx   . Hypertension Father   . Hyperlipidemia Father     SOCIAL HISTORY: Social History   Social History  . Marital status: Married    Spouse name: N/A  . Number of children: 1  . Years of education: N/A   Occupational History  . 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 Smoking status: Former Smoker    Packs/day: 0.12    Years: 4.00    Types: Cigarettes    Quit date: 10/03/1986  . Smokeless tobacco: Never Used  . Alcohol use 3.6 oz/week    6 Cans of beer per week     Comment: 05/30/2012 "drink only on weekends"  . Drug use: No  . Sexual activity: Not Currently   Other Topics Concern  . Not on file   Social History Narrative   Epworth sleepiness scale 15 as of 08/03/15. Pt has college degree.    Drinks 2 cups of caffeine daily.    REVIEW OF SYSTEMS: Constitutional: No fevers, chills, or sweats, no generalized fatigue, change in appetite Eyes: No visual changes, double vision, eye pain Ear, nose and throat: No hearing loss, ear pain, nasal congestion, sore throat Cardiovascular: No chest pain, palpitations Respiratory:  No shortness of breath at rest or with exertion, wheezes GastrointestinaI: No nausea, vomiting, diarrhea, abdominal pain, fecal incontinence Genitourinary:  No dysuria, urinary retention or frequency Musculoskeletal:  No neck pain, back pain Integumentary: No rash, pruritus, skin lesions Neurological: as above Psychiatric: No depression, insomnia, anxiety Endocrine: No palpitations, fatigue, diaphoresis, mood swings, change in appetite, change in weight, increased thirst Hematologic/Lymphatic:  No purpura,  petechiae. Allergic/Immunologic: no itchy/runny eyes, nasal congestion, recent allergic reactions, rashes  PHYSICAL EXAM: Vitals:   07/07/17 1303  SpO2: 97%   General: No acute distress.  Patient appears well-groomed.  normal body habitus. Head:  Normocephalic/atraumatic Eyes:  Fundi examined but not visualized Neck: supple, no paraspinal tenderness, full range of motion Heart:  Regular rate and rhythm Lungs:  Clear to auscultation bilaterally Back: No paraspinal tenderness Neurological Exam: alert and oriented to person, place, and time. Attention span and concentration intact, recent and remote memory intact, fund of knowledge intact.  Speech fluent and not dysarthric, language intact.  CN II-XII intact. Bulk and tone normal, muscle strength 5/5 throughout.  Sensation to light touch, temperature and vibration intact.  Deep tendon reflexes 2+ throughout, toes downgoing.  Finger  to nose and heel to shin testing intact.  Gait normal, Romberg negative.  IMPRESSION: Transient episode of vertigo.  It likely was an episode of peripheral vertigo.  She did not exhibit any lateralizing symptoms suggestive of verteobrobasilar insufficiency/stroke.  Her prior episode of transient aphasia may have been a complicated migraine given the associated headache.  However, I would continue to treat for secondary stroke prevention.  PLAN: 1.  Agree with ASA 81mg  daily and statin therapy (LDL goal less than 70) as per PCP. 2.  Exercise and Mediterranean diet. 3.  Follow up as needed.  Shon Millet, DO  CC: Mcneil Sober, FNP

## 2018-02-19 IMAGING — MR MR HEAD W/O CM
10 series · 48 of 48 positions shown · non-contrast
Comparison: Prior MRI from 09/03/2015.

CLINICAL DATA: Initial evaluation for acute dizziness.

EXAM:
MRI HEAD WITHOUT CONTRAST
TECHNIQUE: Multiplanar, multiecho pulse sequences of the brain and surrounding
structures were obtained without intravenous contrast.

[Series 2: t1_se_sag · sagittal · 5.0mm · 0.45mm/px · 2 of 21 slices shown]
[im 1/21]
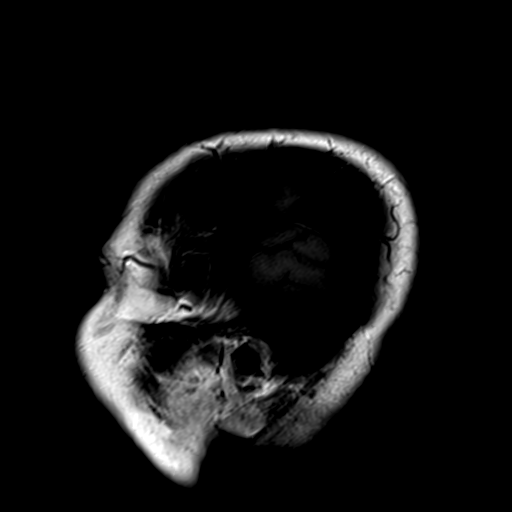
[im 21/21]
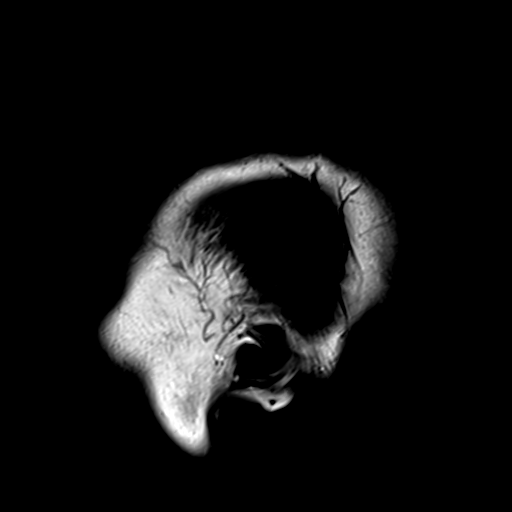

[Series 3: ep2d_diff_(id)_trace · axial · 3.0mm · 1.80mm/px · z∈[-53,+86]mm · 8 of 93 slices shown]
[im 1/93]
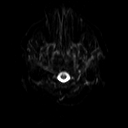
[im 14/93]
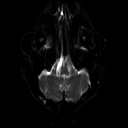
[im 27/93]
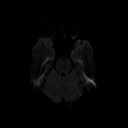
[im 40/93]
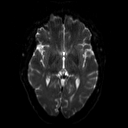
[im 53/93]
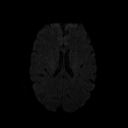
[im 66/93]
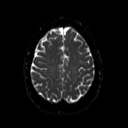
[im 79/93]
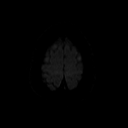
[im 93/93]
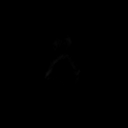

[Series 4: ep2d_diff_(id)_trace_adc · axial · 3.0mm · 1.80mm/px · z∈[-53,+86]mm · 4 of 48 slices shown]
[im 1/48]
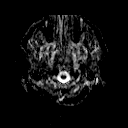
[im 16/48]
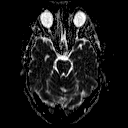
[im 32/48]
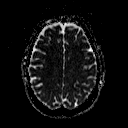
[im 48/48]
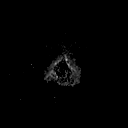

[Series 5: ep2d_diff_cor · coronal · 5.0mm · 1.77mm/px · 5 of 55 slices shown]
[im 1/55]
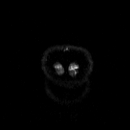
[im 14/55]
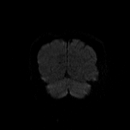
[im 28/55]
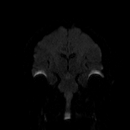
[im 41/55]
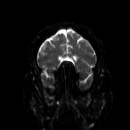
[im 55/55]
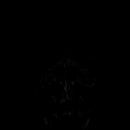

[Series 6: ep2d_diff_cor_adc · coronal · 5.0mm · 1.77mm/px · 2 of 28 slices shown]
[im 1/28]
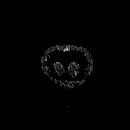
[im 28/28]
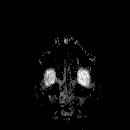

[Series 8: swi_images · axial · 2.0mm · 0.90mm/px · z∈[-61,+95]mm · 7 of 80 slices shown]
[im 1/80]
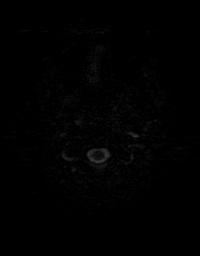
[im 14/80]
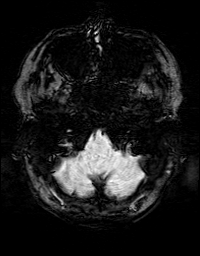
[im 27/80]
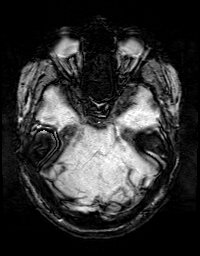
[im 40/80]
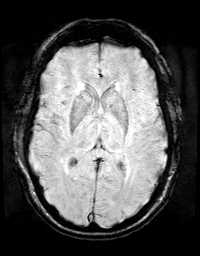
[im 53/80]
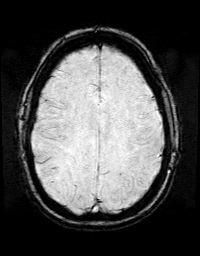
[im 66/80]
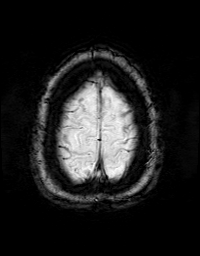
[im 80/80]
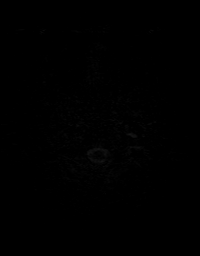

[Series 9: FLAIR · axial · 3.0mm · 0.43mm/px · z∈[-61,+93]mm · 2 of 27 slices shown]
[im 1/27]
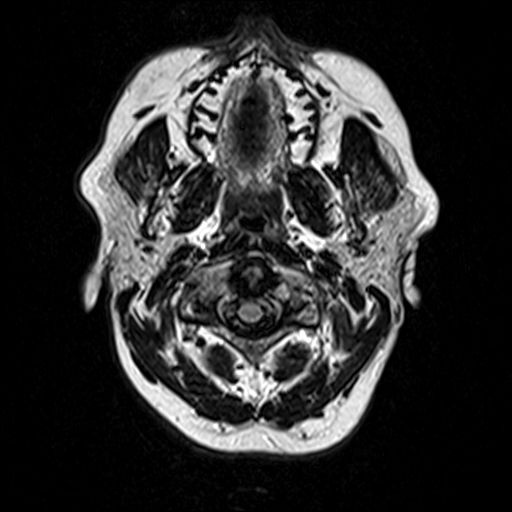
[im 27/27]
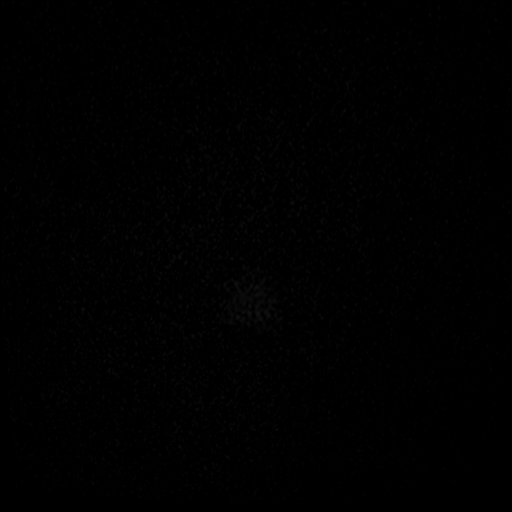

[Series 10: t2_tse_tra_512 · axial · 5.0mm · 0.60mm/px · z∈[-51,+85]mm · 2 of 24 slices shown]
[im 1/24]
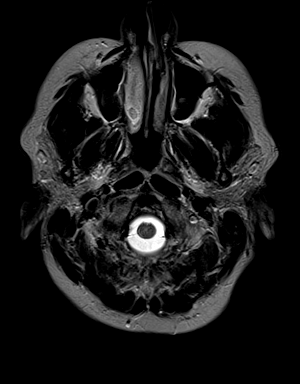
[im 24/24]
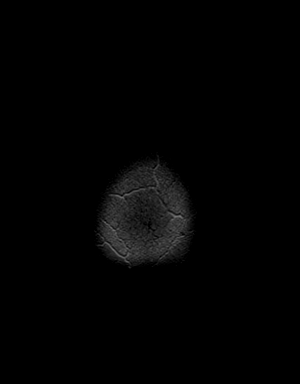

[Series 11: t1_mpr_tra · axial · 1.0mm · 0.72mm/px · z∈[-61,+96]mm · 14 of 160 slices shown]
[im 1/160]
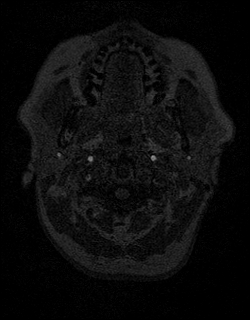
[im 13/160]
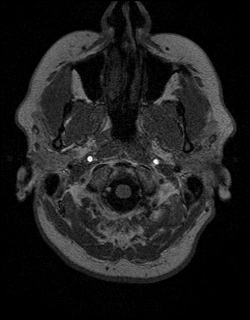
[im 25/160]
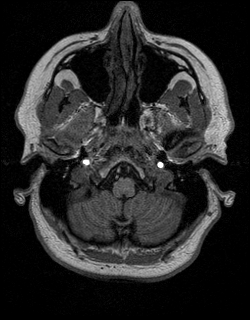
[im 37/160]
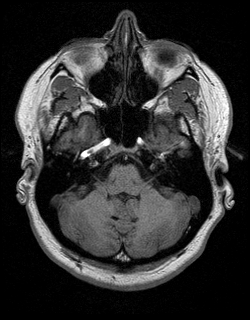
[im 49/160]
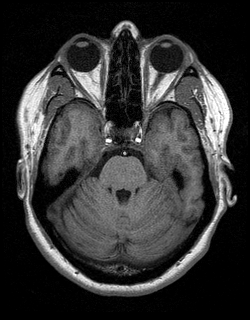
[im 62/160]
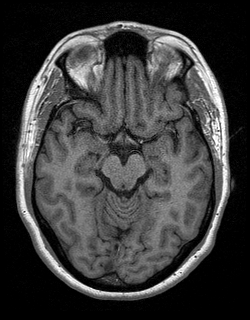
[im 74/160]
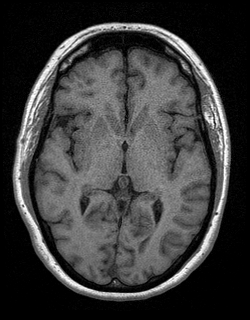
[im 86/160]
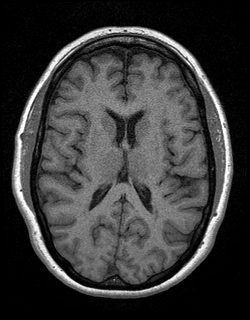
[im 98/160]
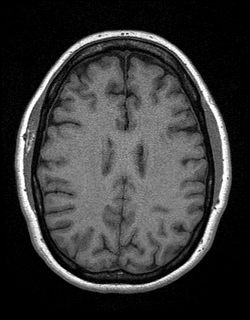
[im 111/160]
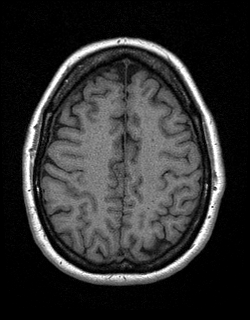
[im 123/160]
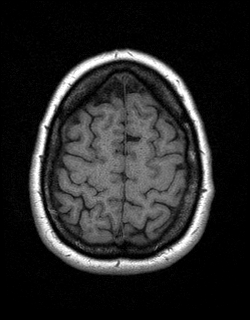
[im 135/160]
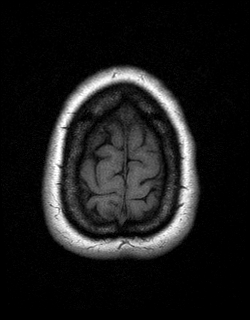
[im 147/160]
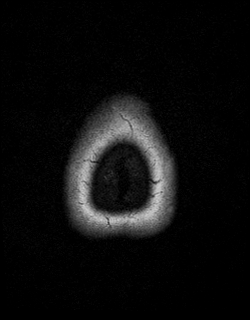
[im 160/160]
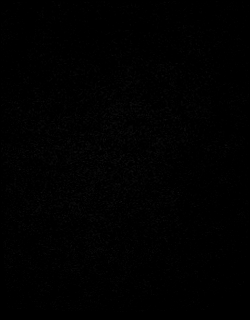

[Series 12: T2 · coronal · 5.0mm · 0.45mm/px · 2 of 28 slices shown]
[im 1/28]
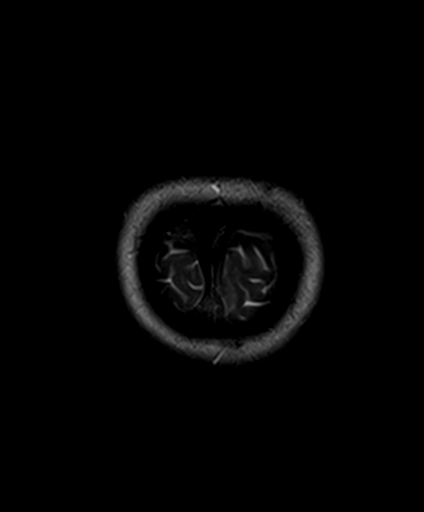
[im 28/28]
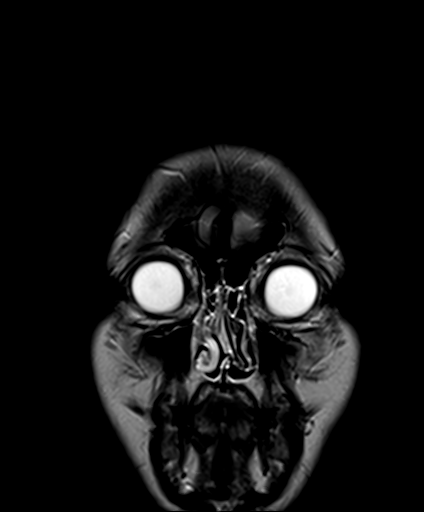

[48 of 48 positions shown; findings below may reference images not displayed]

FINDINGS: Brain: Cerebral volume within normal limits for age. Scattered
patchy T2/FLAIR hyperintensity within the periventricular and deep
white matter both cerebral hemispheres, nonspecific, but most like
related chronic small vessel ischemic changes, mild-to-moderate for
age.

No abnormal foci of restricted diffusion to suggest acute or
subacute ischemia. Gray-white matter differentiation is well
maintained. No encephalomalacia to suggest chronic infarction. No
foci of susceptibility artifact to suggest acute or chronic
intracranial hemorrhage.

No mass lesion, midline shift or mass effect. Ventricles normal in
size without evidence for hydrocephalus. No extra-axial fluid
collection. Major dural sinuses are grossly patent.

Pituitary gland suprasellar region normal. Midline structures intact
and normal.

Vascular: Major intracranial vascular flow voids are maintained.

Skull and upper cervical spine: Craniocervical junction within
normal limits. Visualized upper cervical spine unremarkable. Bone
marrow signal intensity within normal limits. No scalp soft tissue
abnormality.

Sinuses/Orbits: Globes and orbital soft tissues within normal
limits. Mucosal thickening with superimposed retention cyst noted
within the right sphenoid sinus. Paranasal sinuses are otherwise
largely clear. No mastoid effusion. Inner ear structures normal.

Other: None.
IMPRESSION: 1. No acute intracranial abnormality identified.
2. Mild to moderately advanced cerebral white matter disease for
patient age, nonspecific, but most likely related to chronic small
vessel ischemic changes. Appearance is relatively stable as compared
to previous brain MRI from 09/03/2015

## 2023-03-08 ENCOUNTER — Emergency Department (HOSPITAL_BASED_OUTPATIENT_CLINIC_OR_DEPARTMENT_OTHER): Payer: BC Managed Care – PPO

## 2023-03-08 ENCOUNTER — Emergency Department (HOSPITAL_BASED_OUTPATIENT_CLINIC_OR_DEPARTMENT_OTHER)
Admission: EM | Admit: 2023-03-08 | Discharge: 2023-03-08 | Disposition: A | Discharge status: Home / Self Care | Payer: BC Managed Care – PPO | Attending: Emergency Medicine | Admitting: Emergency Medicine

## 2023-03-08 ENCOUNTER — Encounter (HOSPITAL_BASED_OUTPATIENT_CLINIC_OR_DEPARTMENT_OTHER): Payer: Self-pay

## 2023-03-08 ENCOUNTER — Other Ambulatory Visit: Payer: Self-pay

## 2023-03-08 ENCOUNTER — Other Ambulatory Visit (HOSPITAL_BASED_OUTPATIENT_CLINIC_OR_DEPARTMENT_OTHER): Payer: Self-pay

## 2023-03-08 DIAGNOSIS — N200 Calculus of kidney: Secondary | ICD-10-CM | POA: Insufficient documentation

## 2023-03-08 DIAGNOSIS — R1031 Right lower quadrant pain: Secondary | ICD-10-CM | POA: Diagnosis present

## 2023-03-08 LAB — CBC WITH DIFFERENTIAL/PLATELET
Abs Immature Granulocytes: 0.03 10*3/uL (ref 0.00–0.07)
Basophils Absolute: 0.1 10*3/uL (ref 0.0–0.1)
Basophils Relative: 1 %
Eosinophils Absolute: 0.2 10*3/uL (ref 0.0–0.5)
Eosinophils Relative: 2 %
HCT: 39 % (ref 36.0–46.0)
Hemoglobin: 13.4 g/dL (ref 12.0–15.0)
Immature Granulocytes: 0 %
Lymphocytes Relative: 35 %
Lymphs Abs: 2.7 10*3/uL (ref 0.7–4.0)
MCH: 30.7 pg (ref 26.0–34.0)
MCHC: 34.4 g/dL (ref 30.0–36.0)
MCV: 89.4 fL (ref 80.0–100.0)
Monocytes Absolute: 0.6 10*3/uL (ref 0.1–1.0)
Monocytes Relative: 8 %
Neutro Abs: 4.1 10*3/uL (ref 1.7–7.7)
Neutrophils Relative %: 54 %
Platelets: 217 10*3/uL (ref 150–400)
RBC: 4.36 MIL/uL (ref 3.87–5.11)
RDW: 12.3 % (ref 11.5–15.5)
WBC: 7.7 10*3/uL (ref 4.0–10.5)
nRBC: 0 % (ref 0.0–0.2)

## 2023-03-08 LAB — URINALYSIS, ROUTINE W REFLEX MICROSCOPIC
Bilirubin Urine: NEGATIVE
Glucose, UA: NEGATIVE mg/dL
Hgb urine dipstick: NEGATIVE
Ketones, ur: NEGATIVE mg/dL
Leukocytes,Ua: NEGATIVE
Nitrite: NEGATIVE
Protein, ur: NEGATIVE mg/dL
Specific Gravity, Urine: 1.015 (ref 1.005–1.030)
pH: 6.5 (ref 5.0–8.0)

## 2023-03-08 LAB — COMPREHENSIVE METABOLIC PANEL
ALT: 19 U/L (ref 0–44)
AST: 20 U/L (ref 15–41)
Albumin: 4.5 g/dL (ref 3.5–5.0)
Alkaline Phosphatase: 79 U/L (ref 38–126)
Anion gap: 11 (ref 5–15)
BUN: 17 mg/dL (ref 6–20)
CO2: 25 mmol/L (ref 22–32)
Calcium: 9.5 mg/dL (ref 8.9–10.3)
Chloride: 106 mmol/L (ref 98–111)
Creatinine, Ser: 1.03 mg/dL — ABNORMAL HIGH (ref 0.44–1.00)
GFR, Estimated: >60 mL/min (ref >60)
Glucose, Bld: 111 mg/dL — ABNORMAL HIGH (ref 70–99)
Potassium: 4 mmol/L (ref 3.5–5.1)
Sodium: 142 mmol/L (ref 135–145)
Total Bilirubin: 0.8 mg/dL (ref 0.3–1.2)
Total Protein: 7.5 g/dL (ref 6.5–8.1)

## 2023-03-08 LAB — LIPASE, BLOOD: Lipase: 17 U/L (ref 11–51)

## 2023-03-08 MED ORDER — TAMSULOSIN HCL 0.4 MG PO CAPS
0.4000 mg | ORAL_CAPSULE | Freq: Every day | ORAL | 0 refills | Status: AC
  Filled 2023-03-08 (×2): qty 30, 30d supply, fill #0

## 2023-03-08 MED ORDER — MORPHINE SULFATE (PF) 4 MG/ML IV SOLN
4.0000 mg | Freq: Once | INTRAVENOUS | Status: AC
  Administered 2023-03-08: 4 mg via INTRAVENOUS
  Filled 2023-03-08: qty 1

## 2023-03-08 MED ORDER — SODIUM CHLORIDE 0.9 % IV BOLUS
1000.0000 mL | Freq: Once | INTRAVENOUS | Status: AC
  Administered 2023-03-08: 1000 mL via INTRAVENOUS

## 2023-03-08 MED ORDER — OXYCODONE-ACETAMINOPHEN 5-325 MG PO TABS
1.0000 | ORAL_TABLET | Freq: Four times a day (QID) | ORAL | 0 refills | Status: AC | PRN
  Filled 2023-03-08 (×2): qty 15, 4d supply, fill #0

## 2023-03-08 MED ORDER — ONDANSETRON 4 MG PO TBDP
4.0000 mg | ORAL_TABLET | Freq: Three times a day (TID) | ORAL | 0 refills | Status: AC | PRN
  Filled 2023-03-08 (×2): qty 18, 21d supply, fill #0

## 2023-03-08 MED ORDER — ONDANSETRON 4 MG PO TBDP: 4.0000 mg | ORAL_TABLET | Freq: Three times a day (TID) | ORAL | 0 refills | Status: DC | PRN

## 2023-03-08 MED ORDER — OXYCODONE-ACETAMINOPHEN 5-325 MG PO TABS: 1.0000 | ORAL_TABLET | Freq: Four times a day (QID) | ORAL | 0 refills | Status: DC | PRN

## 2023-03-08 MED ORDER — TAMSULOSIN HCL 0.4 MG PO CAPS: 0.4000 mg | ORAL_CAPSULE | Freq: Every day | ORAL | 0 refills | Status: DC

## 2023-03-08 MED ORDER — KETOROLAC TROMETHAMINE 15 MG/ML IJ SOLN
15.0000 mg | Freq: Once | INTRAMUSCULAR | Status: AC
  Administered 2023-03-08: 15 mg via INTRAVENOUS
  Filled 2023-03-08: qty 1

## 2023-03-08 MED ORDER — ONDANSETRON HCL 4 MG/2ML IJ SOLN
4.0000 mg | Freq: Once | INTRAMUSCULAR | Status: AC
  Administered 2023-03-08: 4 mg via INTRAVENOUS
  Filled 2023-03-08: qty 2

## 2023-03-08 NOTE — ED Provider Notes (Signed)
[place] EMERGENCY DEPARTMENT AT [facility] Provider Note   CSN: [id number] Arrival date & time: 03/08/23  1155     History  Chief Complaint  Patient presents with   Flank Pain    AALLIYAH RICKETTS is a 57 y.o. female.  Patient with history of hyperlipidemia presents today with complaints of right flank pain.  She states that same began initially yesterday and felt like soreness.  Pain got persistently worse throughout the day yesterday and was severe into the night and into today as well.  Pain comes and goes without discernible trigger.  It is in her right flank area and does not radiate.  She denies any history of similar symptoms.  No history of abdominal surgeries.  Does note some nausea and vomiting without diarrhea.  She is having regular bowel movements and passing flatus.  Having a bowel movement does not change her symptoms.  Denies any hematuria or dysuria.  No fevers or chills.  The history is provided by the patient. No language interpreter was used.  Flank Pain       Home Medications Prior to Admission medications   Medication Sig Start Date End Date Taking? Authorizing Provider  aspirin EC 81 MG tablet Take 81 mg by mouth daily.   Yes [provider]  buPROPion (WELLBUTRIN SR) 150 MG 12 hr tablet Take 150 mg by mouth daily.   Yes [provider]  celecoxib (CELEBREX) 200 MG capsule Take 200 mg by mouth daily. 07/01/15  Yes [provider]  cholecalciferol (VITAMIN D) 1000 UNITS tablet Take 1,000 Units by mouth daily.   Yes [provider]  fluticasone (FLONASE) 50 MCG/ACT nasal spray Place into both nostrils daily.   Yes [provider]  metoprolol succinate (TOPROL-XL) 50 MG 24 hr tablet Take 50 mg by mouth daily. 07/08/15  Yes [provider]  rosuvastatin (CRESTOR) 5 MG tablet Take 5 mg by mouth daily.   Yes [provider]      Allergies    Patient has no known allergies.    Review of  Systems   Review of Systems  Gastrointestinal:  Positive for nausea and vomiting.  Genitourinary:  Positive for flank pain.  All other systems reviewed and are negative.   Physical Exam Updated Vital Signs BP (!) 159/92   Pulse 86   Temp 98.3 F (36.8 C)   Resp 20   LMP  (LMP Unknown)   SpO2 97%  Physical Exam Vitals and nursing note reviewed.  Constitutional:      General: She is not in acute distress.    Appearance: Normal appearance. She is normal weight. She is not ill-appearing, toxic-appearing or diaphoretic.  HENT:     Head: Normocephalic and atraumatic.  Cardiovascular:     Rate and Rhythm: Normal rate.  Pulmonary:     Effort: Pulmonary effort is normal. No respiratory distress.  Abdominal:     General: Abdomen is flat.     Palpations: Abdomen is soft.     Tenderness: There is no abdominal tenderness. There is no right CVA tenderness or left CVA tenderness.  Musculoskeletal:        General: Normal range of motion.     Cervical back: Normal range of motion.  Skin:    General: Skin is warm and dry.  Neurological:     General: No focal deficit present.     Mental Status: She is alert.  Psychiatric:        Mood  and Affect: Mood normal.        Behavior: Behavior normal.     ED Results / Procedures / Treatments   Labs (all labs ordered are listed, but only abnormal results are displayed) Labs Reviewed  COMPREHENSIVE METABOLIC PANEL - Abnormal; Notable for the following components:      Result Value   Glucose, Bld 111 (*)    Creatinine, Ser 1.03 (*)    All other components within normal limits  URINALYSIS, ROUTINE W REFLEX MICROSCOPIC - Abnormal; Notable for the following components:   Bacteria, UA RARE (*)    All other components within normal limits  LIPASE, BLOOD  CBC WITH DIFFERENTIAL/PLATELET    EKG None  Radiology CT Renal Stone Study  Result Date: 03/08/2023 CLINICAL DATA:  Abdominal pain, stone suspected. EXAM: CT ABDOMEN AND PELVIS WITHOUT  CONTRAST TECHNIQUE: Multidetector CT imaging of the abdomen and pelvis was performed following the standard protocol without IV contrast. RADIATION DOSE REDUCTION: This exam was performed according to the departmental dose-optimization program which includes automated exposure control, adjustment of the mA and/or kV according to patient size and/or use of iterative reconstruction technique. COMPARISON:  CT abdomen pelvis 11/07/2011 FINDINGS: Lower chest: No acute abnormality. Evaluation of the abdominal viscera is limited by the lack of IV contrast. Hepatobiliary: No focal liver abnormality is seen. Normal gallbladder. Pancreas: Unremarkable. No surrounding inflammatory changes. Spleen: Normal in size without focal abnormality. Adrenals/Urinary Tract: Adrenal glands are unremarkable. There is moderate right hydronephrosis secondary to a calculus at the right UVJ measuring 0.3 cm (series 2, image 84). No left renal calculi or hydronephrosis. Urinary bladder is unremarkable. Stomach/Bowel: Stomach is within normal limits. Appendix appears normal. No evidence of bowel wall thickening, distention, or inflammatory changes. Vascular/Lymphatic: No enlarged abdominal or pelvic lymph nodes. Reproductive: Uterus and bilateral adnexa are unremarkable. Other: No abdominal wall hernia or abnormality. No abdominopelvic ascites. Musculoskeletal: L4-5 lumbar fixation hardware in place. No acute osseous finding. IMPRESSION: Moderate right hydronephrosis secondary to a calculus at the right UVJ measuring 0.3 cm. Electronically Signed   By: Emmaline Kluver M.D.   On: 03/08/2023 13:23    Procedures Procedures    Medications Ordered in ED Medications  ondansetron (ZOFRAN) injection 4 mg (4 mg Intravenous Given 03/08/23 1230)  morphine (PF) 4 MG/ML injection 4 mg (4 mg Intravenous Given 03/08/23 1245)  sodium chloride 0.9 % bolus 1,000 mL (0 mLs Intravenous Stopped 03/08/23 1400)  ketorolac (TORADOL) 15 MG/ML injection 15 mg (15  mg Intravenous Given 03/08/23 1403)    ED Course/ Medical Decision Making/ A&P                             Medical Decision Making Amount and/or Complexity of Data Reviewed Labs: ordered. Radiology: ordered.  Risk Prescription drug management.   This patient is a 57 y.o. female who presents to the ED for concern of flank pain, this involves an extensive number of treatment options, and is a complaint that carries with it a high risk of complications and morbidity. The emergent differential diagnosis prior to evaluation includes, but is not limited to,  AAA, renal artery/vein embolism/thrombosis, mesenteric ischemia, pyelonephritis, nephrolithiasis, cystitis, biliary colic, pancreatitis, perforated peptic ulcer, appendicitis, diverticulitis, bowel obstruction, Ectopic Pregnancy, PID/TOA, Ovarian cyst, Ovarian torsion This is not an exhaustive differential.   Past Medical History / Co-morbidities / Social History: Hx hyperlipidemia  Physical Exam: Physical exam performed. The pertinent findings include: Per above, no  abdominal tenderness or CVA tenderness.  Lab Tests: I ordered, and personally interpreted labs.  The pertinent results include: Creatinine 1.03 last check 7 years ago at 0.85   Imaging Studies: I ordered imaging studies including CT renal study. I independently visualized and interpreted imaging which showed   Moderate right hydronephrosis secondary to a calculus at the right UVJ measuring 0.3 cm.  I agree with the radiologist interpretation.   Medications: I ordered medication including Toradol, morphine, Zofran, fluids for pain, nausea/vomiting, dehydration. Reevaluation of the patient after these medicines showed that the patient improved. I have reviewed the patients home medicines and have made adjustments as needed.  Disposition: After consideration of the diagnostic results and the patients response to treatment, I feel that emergency department workup does not  suggest an emergent condition requiring admission or immediate intervention beyond what has been performed at this time. The plan is: Discharge with Flomax and Percocet for pain with urology referral.  Patient has a kidney stone on the right side consistent with her pain.  PDMP reviewed.  Patient advised not to drive or operate heavy machinery while taking this medication.  No signs of superimposed infection.  Pain well-controlled at this time.  She is able to eat and drink without any residual episodes of nausea or vomiting.  Will also give Zofran for any residual nausea/vomiting. Evaluation and diagnostic testing in the emergency department does not suggest an emergent condition requiring admission or immediate intervention beyond what has been performed at this time.  Plan for discharge with close PCP follow-up.  Patient is understanding and amenable with plan, educated on red flag symptoms that would prompt immediate return.  Patient discharged in stable condition.  Final Clinical Impression(s) / ED Diagnoses Final diagnoses:  Kidney stone on right side    Rx / DC Orders ED Discharge Orders          Ordered    oxyCODONE-acetaminophen (PERCOCET/ROXICET) 5-325 MG tablet  Every 6 hours PRN        03/08/23 1551    tamsulosin (FLOMAX) 0.4 MG CAPS capsule  Daily        03/08/23 1551    ondansetron (ZOFRAN-ODT) 4 MG disintegrating tablet  Every 8 hours PRN        03/08/23 1551          An After Visit Summary was printed and given to the patient.     Vear Clock 03/08/23 1553    Arby Barrette, MD 03/09/23 [phone]

## 2023-03-08 NOTE — ED Notes (Signed)
Patient transported to CT 

## 2023-03-08 NOTE — ED Triage Notes (Signed)
Pt c/o dull pain in R flank area starting yesterday, increased to sharp pain this am. No blood in urine or difficulty urinating. NAD noted. N/V. VSS. C/o pain 10/10 currently.

## 2023-03-08 NOTE — ED Notes (Signed)
ED Provider at bedside. 

## 2023-03-08 NOTE — ED Notes (Signed)
Pt returned from CT °

## 2023-03-08 NOTE — Discharge Instructions (Addendum)
As we discussed, CT imaging shows you have a 3 mm stone on the right side which is likely the cause of your pain.  This should pass on its own without intervention.  I have given you a prescription for Flomax which is a medication to dilate your ureters and help the stone pass.  Please take it every day as prescribed until you either see the stone pass or stop having pain.  I have given you a prescription for Percocet which is a narcotic pain medication for you to take as prescribed as needed for severe pain only.  Do not drive or operate heavy machinery while taking this medication as it can be sedating.  I have also given you a prescription for Zofran to take for any residual nausea/vomiting.  I have given you a referral to urology with a number to call to schedule appointment for follow-up  Return if development of any new or worsening symptoms.

## 2023-03-08 NOTE — ED Notes (Signed)
Pt discharged in stable condition. Pt and sister expressed understanding about discharge instructions and to follow up with Urology and to return to ED for any further concerns or complications. Pt ambulated out with even steady gait, no apparent distress.

## 2023-03-08 NOTE — ED Notes (Addendum)
Pt ambulated to the restroom with even steady slow gait.  Pt ambulated back to room, no apparent distress.

## 2023-07-26 ENCOUNTER — Other Ambulatory Visit (HOSPITAL_COMMUNITY): Payer: Self-pay | Admitting: Registered Nurse

## 2023-07-26 ENCOUNTER — Ambulatory Visit (HOSPITAL_COMMUNITY): Payer: BC Managed Care – PPO

## 2023-07-26 ENCOUNTER — Encounter (HOSPITAL_COMMUNITY): Payer: Self-pay

## 2023-07-26 ENCOUNTER — Ambulatory Visit (HOSPITAL_COMMUNITY)
Admission: RE | Admit: 2023-07-26 | Discharge: 2023-07-26 | Disposition: A | Discharge status: Home / Self Care | Payer: BC Managed Care – PPO | Source: Ambulatory Visit | Attending: Registered Nurse | Admitting: Registered Nurse

## 2023-07-26 DIAGNOSIS — R519 Headache, unspecified: Secondary | ICD-10-CM | POA: Insufficient documentation

## 2023-07-26 MED ORDER — IOHEXOL 300 MG/ML  SOLN
75.0000 mL | Freq: Once | INTRAMUSCULAR | Status: AC | PRN
  Administered 2023-07-26: 75 mL via INTRAVENOUS
